# Patient Record
Sex: Female | Born: 1955 | Race: White | Hispanic: No | Marital: Married
Health system: Southern US, Academic
[De-identification: ages and names within clinical notes are randomized; demographics above are authoritative.]

## PROBLEM LIST (undated history)

## (undated) ENCOUNTER — Encounter

## (undated) ENCOUNTER — Telehealth

## (undated) ENCOUNTER — Ambulatory Visit: Payer: MEDICARE | Attending: Clinical | Primary: Clinical

## (undated) ENCOUNTER — Ambulatory Visit: Payer: MEDICARE

## (undated) ENCOUNTER — Ambulatory Visit
Payer: MEDICARE | Attending: Student in an Organized Health Care Education/Training Program | Primary: Student in an Organized Health Care Education/Training Program

## (undated) ENCOUNTER — Encounter
Attending: Student in an Organized Health Care Education/Training Program | Primary: Student in an Organized Health Care Education/Training Program

## (undated) ENCOUNTER — Ambulatory Visit: Payer: MEDICARE | Attending: Family | Primary: Family

## (undated) ENCOUNTER — Ambulatory Visit

## (undated) ENCOUNTER — Other Ambulatory Visit

## (undated) ENCOUNTER — Ambulatory Visit: Attending: Multi-Specialty | Primary: Multi-Specialty

## (undated) ENCOUNTER — Encounter
Attending: Pharmacist Clinician (PhC)/ Clinical Pharmacy Specialist | Primary: Pharmacist Clinician (PhC)/ Clinical Pharmacy Specialist

## (undated) ENCOUNTER — Ambulatory Visit: Payer: Medicare (Managed Care)

## (undated) ENCOUNTER — Inpatient Hospital Stay

## (undated) ENCOUNTER — Ambulatory Visit: Attending: Hematology & Oncology | Primary: Hematology & Oncology

## (undated) ENCOUNTER — Encounter: Attending: Clinical | Primary: Clinical

## (undated) DIAGNOSIS — E669 Obesity, unspecified: Secondary | ICD-10-CM

## (undated) DIAGNOSIS — R112 Nausea with vomiting, unspecified: Secondary | ICD-10-CM

## (undated) DIAGNOSIS — F17200 Nicotine dependence, unspecified, uncomplicated: Secondary | ICD-10-CM

## (undated) DIAGNOSIS — M1990 Unspecified osteoarthritis, unspecified site: Secondary | ICD-10-CM

## (undated) DIAGNOSIS — I110 Hypertensive heart disease with heart failure: Secondary | ICD-10-CM

## (undated) DIAGNOSIS — I499 Cardiac arrhythmia, unspecified: Secondary | ICD-10-CM

## (undated) DIAGNOSIS — J449 Chronic obstructive pulmonary disease, unspecified: Secondary | ICD-10-CM

## (undated) DIAGNOSIS — E559 Vitamin D deficiency, unspecified: Secondary | ICD-10-CM

## (undated) DIAGNOSIS — I8500 Esophageal varices without bleeding: Secondary | ICD-10-CM

## (undated) DIAGNOSIS — M3500 Sicca syndrome, unspecified: Secondary | ICD-10-CM

## (undated) DIAGNOSIS — D649 Anemia, unspecified: Secondary | ICD-10-CM

## (undated) DIAGNOSIS — G4730 Sleep apnea, unspecified: Secondary | ICD-10-CM

## (undated) DIAGNOSIS — K7581 Nonalcoholic steatohepatitis (NASH): Secondary | ICD-10-CM

## (undated) DIAGNOSIS — F32A Depression, unspecified: Secondary | ICD-10-CM

## (undated) DIAGNOSIS — K219 Gastro-esophageal reflux disease without esophagitis: Secondary | ICD-10-CM

## (undated) DIAGNOSIS — E785 Hyperlipidemia, unspecified: Secondary | ICD-10-CM

## (undated) DIAGNOSIS — R160 Hepatomegaly, not elsewhere classified: Secondary | ICD-10-CM

## (undated) DIAGNOSIS — K769 Liver disease, unspecified: Secondary | ICD-10-CM

## (undated) DIAGNOSIS — I10 Essential (primary) hypertension: Secondary | ICD-10-CM

## (undated) DIAGNOSIS — I639 Cerebral infarction, unspecified: Secondary | ICD-10-CM

## (undated) DIAGNOSIS — F419 Anxiety disorder, unspecified: Secondary | ICD-10-CM

## (undated) DIAGNOSIS — L853 Xerosis cutis: Secondary | ICD-10-CM

## (undated) DIAGNOSIS — N200 Calculus of kidney: Secondary | ICD-10-CM

## (undated) DIAGNOSIS — I447 Left bundle-branch block, unspecified: Secondary | ICD-10-CM

## (undated) DIAGNOSIS — N189 Chronic kidney disease, unspecified: Secondary | ICD-10-CM

## (undated) DIAGNOSIS — Z9889 Other specified postprocedural states: Secondary | ICD-10-CM

## (undated) DIAGNOSIS — K7460 Unspecified cirrhosis of liver: Secondary | ICD-10-CM

## (undated) DIAGNOSIS — E119 Type 2 diabetes mellitus without complications: Secondary | ICD-10-CM

## (undated) HISTORY — DX: Nicotine dependence, unspecified, uncomplicated: F17.200

## (undated) HISTORY — DX: Chronic kidney disease, unspecified: N18.9

## (undated) HISTORY — DX: Nonalcoholic steatohepatitis (NASH): K75.81

## (undated) HISTORY — DX: Chronic obstructive pulmonary disease, unspecified: J44.9

## (undated) HISTORY — DX: Xerosis cutis: L85.3

## (undated) HISTORY — DX: Sjogren syndrome, unspecified: M35.00

## (undated) HISTORY — DX: Calculus of kidney: N20.0

## (undated) HISTORY — DX: Vitamin D deficiency, unspecified: E55.9

## (undated) HISTORY — PX: HAND SURGERY: SHX662

## (undated) HISTORY — DX: Anxiety disorder, unspecified: F41.9

## (undated) HISTORY — DX: Gastro-esophageal reflux disease without esophagitis: K21.9

## (undated) HISTORY — DX: Left bundle-branch block, unspecified: I44.7

## (undated) HISTORY — DX: Sleep apnea, unspecified: G47.30

## (undated) HISTORY — PX: CARPAL TUNNEL RELEASE: SHX101

## (undated) HISTORY — PX: ROTATOR CUFF REPAIR: SHX139

## (undated) HISTORY — DX: Hepatomegaly, not elsewhere classified: R16.0

## (undated) HISTORY — DX: Unspecified cirrhosis of liver: K74.60

## (undated) HISTORY — PX: INGUINAL HERNIA REPAIR: SHX194

## (undated) HISTORY — DX: Depression, unspecified: F32.A

## (undated) HISTORY — DX: Type 2 diabetes mellitus without complications: E11.9

## (undated) HISTORY — DX: Hypertensive heart disease with heart failure: I11.0

## (undated) HISTORY — DX: Unspecified osteoarthritis, unspecified site: M19.90

## (undated) HISTORY — DX: Cerebral infarction, unspecified: I63.9

## (undated) HISTORY — DX: Liver disease, unspecified: K76.9

## (undated) HISTORY — PX: TONSILLECTOMY: SUR1361

## (undated) HISTORY — PX: BACK SURGERY: SHX140

## (undated) HISTORY — DX: Esophageal varices without bleeding: I85.00

## (undated) HISTORY — DX: Cardiac arrhythmia, unspecified: I49.9

## (undated) HISTORY — PX: CHOLECYSTECTOMY: SHX55

## (undated) HISTORY — DX: Essential (primary) hypertension: I10

## (undated) HISTORY — PX: WRIST SURGERY: SHX841

## (undated) HISTORY — DX: Obesity, unspecified: E66.9

## (undated) HISTORY — DX: Anemia, unspecified: D64.9

## (undated) HISTORY — DX: Hyperlipidemia, unspecified: E78.5

---
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
## 1987-09-22 HISTORY — PX: ABDOMINAL HYSTERECTOMY: SHX81

## 1998-04-04 ENCOUNTER — Inpatient Hospital Stay (HOSPITAL_COMMUNITY): Admission: RE | Admit: 1998-04-04 | Discharge: 1998-04-08 | Payer: Self-pay | Admitting: Neurosurgery

## 2006-05-20 ENCOUNTER — Inpatient Hospital Stay (HOSPITAL_COMMUNITY): Admission: RE | Admit: 2006-05-20 | Discharge: 2006-05-25 | Payer: Self-pay | Admitting: Neurosurgery

## 2006-05-26 ENCOUNTER — Emergency Department (HOSPITAL_COMMUNITY): Admission: EM | Admit: 2006-05-26 | Discharge: 2006-05-27 | Payer: Self-pay | Admitting: Emergency Medicine

## 2006-12-23 ENCOUNTER — Ambulatory Visit (HOSPITAL_COMMUNITY): Admission: RE | Admit: 2006-12-23 | Discharge: 2006-12-23 | Payer: Self-pay | Admitting: Neurosurgery

## 2007-06-19 ENCOUNTER — Encounter: Admission: RE | Admit: 2007-06-19 | Discharge: 2007-06-19 | Payer: Self-pay | Admitting: Neurosurgery

## 2008-03-16 ENCOUNTER — Encounter: Admission: RE | Admit: 2008-03-16 | Discharge: 2008-03-16 | Payer: Self-pay | Admitting: Specialist

## 2011-02-06 NOTE — Op Note (Signed)
NAME:  SHAQUEL, JOSEPHSON NO.:  [id number]   MEDICAL RECORD NO.:  [id number]          PATIENT TYPE:  INP   LOCATION:  8882                         FACILITY:  [facility]   PHYSICIAN:  Ophelia Charter, M.D.DATE OF BIRTH:  1956/05/20   DATE OF PROCEDURE:  05/20/2006  DATE OF DISCHARGE:                                 OPERATIVE REPORT   BRIEF HISTORY:  The patient is a 55 year old white female who underwent a  previous laminectomy at L4 and L5 by another physician about 9 years ago.  She initially did well, but over the years has developed increasing back and  radicular leg pain.  She failed medical management and was worked up with a  lumbar MRI, which demonstrated the patient and severe spinal stenosis at L3-  4 as well as degenerative disease and spondylolisthesis and stenosis at L4-  L5 and L5-S1.  I discussed the various treatment with the patient including  surgery.  The patient has weighed the risks, benefits and alternatives to  surgery and has decided to proceed with a lumbar decompression and fusion.   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   PROCEDURE:  Redo L4 laminectomy; L3 laminectomy; L3-4 and L4-5  transforaminal lumbar interbody fusion with insertion of Danek Peek  interbody prosthesis (Capstone Peek cages) at L3-4 and L4-5; posterior  segmental instrumentation, L3 to S1, with Legacy titanium pedicle screws and  rods; posterolateral arthrodesis L3-4 and L4-5 and L5-S1 using local  morcelized autograft bone and VITOSS bone graft extender.   SURGEON:  Ophelia Charter, M.D.   ASSISTANT:  Hazle Coca, M.D.   ANESTHESIA:  General endotracheal.   ESTIMATED BLOOD LOSS:  600 mL.   SPECIMENS:  None.   DRAINS:  None.   COMPLICATIONS:  Dural tear.   DESCRIPTION OF PROCEDURE:  The patient was brought to operating room by the  anesthesia team.  General endotracheal anesthesia was induced.  The patient  was turned to the prone position on the Wilson frame.  Her lumbosacral  region was then prepared with Betadine scrub and Betadine solution and  sterile drapes were applied.  I then injected area to be incised with  Marcaine with epinephrine solution and then used a scalpel to make a linear  midline incision through the patient's previous surgical scar.  I used  electrocautery to perform a bilateral subperiosteal dissection, exposing the  spinous process of L2, 3, the remainder of L4 and facets bilaterally at L5  and the upper sacrum.  I obtained an intraoperative radiograph to confirm  our location.  I then inserted VersaTack retractor for exposure.  Then I  used a high-speed drill to extend the previous L4 laminotomy in a cephalad  direction, until I encountered some relatively non-scarred dura.  I then  used a Kerrison punch to complete the L4 laminectomies and I removed the L3-  4 ligamentum flavum.  I then used a high-speed drill to perform bilateral L3  laminotomies.  I widened the laminotomies with a Kerrison punch,  decompressing the thecal sac and the bilateral L3 and L4 nerve roots.  I  then performed a foraminotomy about the bilateral L5 nerve roots, dissecting  through the epidural scar tissue.  In the process of performed a  foraminotomy about the L5 nerve root, a dural tear was created.  The  arachnoid was pooching through the dural tear, but there was no CSF leak.  The tear was anterolateral to the nerve root and it looked like it would be  next to impossible to suture repair; I therefore laid Duragen over these  this dural tear and at the end of the case, we placed Tisseel over the  DuraGen.  There was no CSF leak.    Having completed the decompression at L3-4 and L4-L5, I now turned my  attention to the posterolateral interbody fusion.  I performed a TLIF from  the right side at L3-4 and L4-5, i.e., I incised the L3-4 and 4-5  intervertebral disks and performed an aggressive diskectomy at L3-4 and 4-5,  clearing the vertebral endplates of soft tissue using the Epstein and  Scoville curettes.  I then inserted a 14 x 26-mm Capstone Peek cage which  had been prefilled with local autograft bone and VITOSS into the L3-4  interspace.  I attempted to turn it laterally with the bone tamps, but it  would not turned; I therefore left it somewhat tangential within the disk  space.  I filled ventral and posterior to the prosthesis with local  autograft bone and VITOSS, completing the posterolateral interbody fusion at  that level.  I inserted a 12 x 26-mm Capstone Peek cage into the L4-5  interspace; I was able to turns this one laterally and filled anterior and  posterior to the prosthesis in the intervertebral disk space with local  autograft bone and VITOSS, completing the posterior lumbar fusion at that  level.   We now turned attention to the posterior segmental instrumentation.  Under  fluoroscopic guidance, I cannulated the bilateral L3, 4 5 and S1 pedicles  with the bone probe.  There was limited visualization of the pedicles  because of this patient's body habitus.  I tapped the pedicles with a 5.5-mm  tap.  I then inserted a combination of 6.5 and 7.5 x 45 and a 50-mm pedicle  screws bilaterally at the L3, 4, 5 and S1 pedicles.  I probed about the  medial aspect of the L3, 4 and 5 pedicles and noted there were no cortical  breeches and the nerve roots were not injured.  I then connected the  unilateral pedicle screws with the lordotic rod.  I secured the rod in place  with the caps, which were tightened appropriately into place across the  connector between the rods, which we tightened appropriately.   This completed the instrumentation.  I then used the high-speed drill to  decorticate the remainder of the L3, 4-5 and 5-1 facets and pars.  I then  laid a combination of local morcelized autograft bone and VITOSS over these  decorticated posterolateral structures, completing the posterolateral  arthrodesis.  I then inspected the thecal sac and bilateral L3, 4 and 5  nerve roots and noted that they were well-decompressed.  We then irrigated  the wound out with bacitracin solution, removed the solution and then  removed the VersaTrac retractor and then  reapproximated the patient's  thoracolumbar fascia with interrupted #1 Vicryl suture, subcutaneous tissue  with interrupted 2-0 Vicryl suture and the skin with Steri-Strips and  Benzoin.  The wound was then coated with bacitracin ointment and a sterile  dressing applied.  The drapes were removed and the patient was subsequently  returned to the supine position and subsequently extubated by the anesthesia  team and transported to the postanesthesia care unit in stable condition.  All sponge, instrument and needle counts were correct at the end of this  case.      Ophelia Charter, M.D.  Electronically Signed     JDJ/MEDQ  D:  05/20/2006  T:  05/21/2006  Job:  [id number]

## 2011-02-06 NOTE — Discharge Summary (Signed)
NAME:  Rebecca Jimenez, Rebecca Jimenez NO.:  [id number]   MEDICAL RECORD NO.:  [id number]          PATIENT TYPE:  EMS   LOCATION:  MAJO                         FACILITY:  [facility] [facility]   PHYSICIAN:  Ophelia Charter, M.D.DATE OF BIRTH:  [birth year]   DATE OF ADMISSION:  05/26/2006  DATE OF DISCHARGE:  05/25/2006                                 DISCHARGE SUMMARY   BRIEF HISTORY:  The patient is a 55 year old white female who underwent a  previous L4 and L5 laminectomy by another physician about 9 years ago.  She  initially did well but over the years has developed increasing back and  radicular leg pain.  She failed medical management.  Was worked up with a  lumbar MRI which demonstrated the patient had severe spinal stenosis at L3-  4, as well as degenerative disk disease and spondylolisthesis and stenosis  at L4-5 and L5-S1.  I discussed the various treatment options with the  patient, including surgery.  The patient is aware of the risks, benefits,  and alternatives to surgery and decided to proceed with a lumbar  decompression and fusion.   For further details of this admission, please refer to typed history and  physical.   HOSPITAL COURSE:  Admitted the patient to [facility] on May 20, 2006.  On the day of admission, I performed an L3-4, L4-L5, L5-S1  decompression and fusion.  At surgery, there was a dural tear with some CSF  leak.  The patient was kept flat for a couple of days after surgery.  The  remainder of her hospital course was unremarkable.  By postop day #5, the  patient was afebrile.  Breath sounds stable.  She was eating well,  ambulating well, and had no headache.  Was requesting discharge to home.  She was therefore discharged to home on May 25, 2006.   DISCHARGE INSTRUCTIONS:  The patient was given written discharge  instructions.  Instructed to follow up with me in 4 weeks.   DISCHARGE PRESCRIPTIONS:  Lortab 10, #100, one p.o. q.4h. p.r.n. for  pain;  Valium 5 mg, #50, one p.o. q.6h. p.r.n. for muscle spasm.   FINAL DIAGNOSES:  1. 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  2. L4-5 and L5-S1 degenerative disease, spondylolysis, stenosis, and      lumbar radiculopathy lumbago.   PROCEDURES PERFORMED:  Redo L4 laminectomy; L3 laminectomy; L3-4 and L4-5  transforaminal lumbar interbody fusion with insertion of Capstone PEEK cages  at L3-4 and L4-5; posterior segmental instrumentation L3 to S1 with Legacy  titanium pedicle screws and rods; posterolateral arthrodesis L3-4, L4-5, and  L5-S1 utilizing local morcellized autograft bone and Vitoss bone graft  extender.      Ophelia Charter, M.D.  Electronically Signed     JDJ/MEDQ  D:  06/10/2006  T:  06/10/2006  Job:  [id number]

## 2011-04-14 ENCOUNTER — Encounter (HOSPITAL_BASED_OUTPATIENT_CLINIC_OR_DEPARTMENT_OTHER): Payer: Medicare Other | Attending: General Surgery

## 2011-04-14 DIAGNOSIS — M7989 Other specified soft tissue disorders: Secondary | ICD-10-CM | POA: Insufficient documentation

## 2011-04-14 DIAGNOSIS — I10 Essential (primary) hypertension: Secondary | ICD-10-CM | POA: Insufficient documentation

## 2011-04-14 DIAGNOSIS — E119 Type 2 diabetes mellitus without complications: Secondary | ICD-10-CM | POA: Insufficient documentation

## 2011-04-14 DIAGNOSIS — I872 Venous insufficiency (chronic) (peripheral): Secondary | ICD-10-CM | POA: Insufficient documentation

## 2011-04-14 DIAGNOSIS — L02419 Cutaneous abscess of limb, unspecified: Secondary | ICD-10-CM | POA: Insufficient documentation

## 2011-04-14 DIAGNOSIS — Z79899 Other long term (current) drug therapy: Secondary | ICD-10-CM | POA: Insufficient documentation

## 2011-05-19 ENCOUNTER — Encounter (HOSPITAL_BASED_OUTPATIENT_CLINIC_OR_DEPARTMENT_OTHER): Payer: Medicare Other

## 2011-05-19 DIAGNOSIS — E119 Type 2 diabetes mellitus without complications: Secondary | ICD-10-CM | POA: Insufficient documentation

## 2011-05-19 DIAGNOSIS — Z79899 Other long term (current) drug therapy: Secondary | ICD-10-CM | POA: Insufficient documentation

## 2011-05-19 DIAGNOSIS — I10 Essential (primary) hypertension: Secondary | ICD-10-CM | POA: Insufficient documentation

## 2011-05-19 DIAGNOSIS — I872 Venous insufficiency (chronic) (peripheral): Secondary | ICD-10-CM | POA: Insufficient documentation

## 2011-05-19 DIAGNOSIS — M7989 Other specified soft tissue disorders: Secondary | ICD-10-CM | POA: Insufficient documentation

## 2011-05-19 DIAGNOSIS — L03119 Cellulitis of unspecified part of limb: Secondary | ICD-10-CM | POA: Insufficient documentation

## 2011-05-19 DIAGNOSIS — L02419 Cutaneous abscess of limb, unspecified: Secondary | ICD-10-CM | POA: Insufficient documentation

## 2011-06-24 ENCOUNTER — Other Ambulatory Visit: Payer: Self-pay | Admitting: Rheumatology

## 2011-06-24 DIAGNOSIS — R609 Edema, unspecified: Secondary | ICD-10-CM

## 2011-07-01 ENCOUNTER — Ambulatory Visit
Admission: RE | Admit: 2011-07-01 | Discharge: 2011-07-01 | Disposition: A | Discharge status: Home / Self Care | Payer: BC Managed Care – PPO | Source: Ambulatory Visit | Attending: Rheumatology | Admitting: Rheumatology

## 2011-07-01 DIAGNOSIS — R609 Edema, unspecified: Secondary | ICD-10-CM

## 2011-07-14 ENCOUNTER — Other Ambulatory Visit: Payer: Self-pay | Admitting: Rheumatology

## 2011-07-14 DIAGNOSIS — K118 Other diseases of salivary glands: Secondary | ICD-10-CM
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
## 2011-07-18 ENCOUNTER — Ambulatory Visit
Admission: RE | Admit: 2011-07-18 | Discharge: 2011-07-18 | Disposition: A | Discharge status: Home / Self Care | Payer: BC Managed Care – PPO | Source: Ambulatory Visit | Attending: Rheumatology | Admitting: Rheumatology

## 2011-07-18 DIAGNOSIS — K118 Other diseases of salivary glands: Secondary | ICD-10-CM

## 2011-07-18 MED ORDER — GADOBENATE DIMEGLUMINE 529 MG/ML IV SOLN
20.0000 mL | Freq: Once | INTRAVENOUS | Status: AC | PRN
  Administered 2011-07-18: 20 mL via INTRAVENOUS

## 2011-09-22 HISTORY — PX: KNEE SURGERY: SHX244

## 2013-01-16 ENCOUNTER — Ambulatory Visit (INDEPENDENT_AMBULATORY_CARE_PROVIDER_SITE_OTHER): Payer: BC Managed Care – PPO | Admitting: Neurology

## 2013-01-16 ENCOUNTER — Encounter: Payer: Self-pay | Admitting: Neurology

## 2013-01-16 VITALS — BP 136/68 | HR 71 | Ht 63.5 in | Wt 284.0 lb

## 2013-01-16 DIAGNOSIS — R269 Unspecified abnormalities of gait and mobility: Secondary | ICD-10-CM
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
## 2013-01-23 ENCOUNTER — Ambulatory Visit
Admission: RE | Admit: 2013-01-23 | Discharge: 2013-01-23 | Disposition: A | Discharge status: Home / Self Care | Payer: Medicare Other | Source: Ambulatory Visit | Attending: Neurology | Admitting: Neurology

## 2013-01-23 DIAGNOSIS — R269 Unspecified abnormalities of gait and mobility: Secondary | ICD-10-CM

## 2013-01-24 ENCOUNTER — Ambulatory Visit: Payer: BC Managed Care – PPO | Attending: Neurology | Admitting: Physical Therapy

## 2013-01-24 DIAGNOSIS — R269 Unspecified abnormalities of gait and mobility: Secondary | ICD-10-CM | POA: Insufficient documentation

## 2013-01-24 DIAGNOSIS — IMO0001 Reserved for inherently not codable concepts without codable children: Secondary | ICD-10-CM | POA: Insufficient documentation

## 2013-01-24 DIAGNOSIS — M6281 Muscle weakness (generalized): Secondary | ICD-10-CM | POA: Insufficient documentation

## 2013-01-27 ENCOUNTER — Ambulatory Visit: Payer: BC Managed Care – PPO | Admitting: Physical Therapy

## 2013-01-30 ENCOUNTER — Ambulatory Visit: Payer: BC Managed Care – PPO

## 2013-01-31 NOTE — Progress Notes (Signed)
Quick Note:  I have called and left message to Rebecca Jimenez, MRI brain showed no acute lesion, no change compared in Nov 2013. ______

## 2013-02-01 ENCOUNTER — Ambulatory Visit: Payer: BC Managed Care – PPO

## 2013-02-06 ENCOUNTER — Ambulatory Visit: Payer: BC Managed Care – PPO

## 2013-02-08 ENCOUNTER — Ambulatory Visit: Payer: BC Managed Care – PPO

## 2013-02-08 ENCOUNTER — Telehealth: Payer: Self-pay | Admitting: Neurology

## 2013-02-08 MED ORDER — GABAPENTIN (ONCE-DAILY) 300 MG PO TABS: 300.0000 mg | ORAL_TABLET | Freq: Three times a day (TID) | ORAL | Status: DC

## 2013-02-08 NOTE — Telephone Encounter (Signed)
i have called her, she complains of headaches, also left facial numbness, tingly, lasting few hours.  I will gabapentin 353m tid.

## 2013-02-08 NOTE — Telephone Encounter (Signed)
Physical Therapist Anderson Malta @ [facility] requesting c/b in reference to patient's condition. Returned call no answer.

## 2013-02-08 NOTE — Telephone Encounter (Signed)
Having episodes of tingling all over face , had one this morning, when having them patient feels speech is slurred.  Is this normal for trigimenal neuralgia?

## 2013-02-14 ENCOUNTER — Ambulatory Visit: Payer: BC Managed Care – PPO | Admitting: Physical Therapy

## 2013-02-16 ENCOUNTER — Encounter: Payer: Self-pay | Admitting: Neurology

## 2013-02-16 ENCOUNTER — Ambulatory Visit: Payer: BC Managed Care – PPO | Admitting: Physical Therapy

## 2013-02-16 ENCOUNTER — Ambulatory Visit (INDEPENDENT_AMBULATORY_CARE_PROVIDER_SITE_OTHER): Payer: BC Managed Care – PPO | Admitting: Neurology

## 2013-02-16 VITALS — BP 153/79 | HR 98 | Ht 63.5 in | Wt 286.0 lb

## 2013-02-16 DIAGNOSIS — E785 Hyperlipidemia, unspecified: Secondary | ICD-10-CM

## 2013-02-16 DIAGNOSIS — R519 Headache, unspecified: Secondary | ICD-10-CM

## 2013-02-16 DIAGNOSIS — R51 Headache: Secondary | ICD-10-CM

## 2013-02-16 DIAGNOSIS — I10 Essential (primary) hypertension: Secondary | ICD-10-CM

## 2013-02-16 DIAGNOSIS — E669 Obesity, unspecified: Secondary | ICD-10-CM

## 2013-02-16 MED ORDER — DICLOFENAC POTASSIUM(MIGRAINE) 50 MG PO PACK: 50.0000 mg | PACK | ORAL | Status: DC | PRN

## 2013-02-16 MED ORDER — TOPIRAMATE 50 MG PO TABS: 100.0000 mg | ORAL_TABLET | Freq: Two times a day (BID) | ORAL | Status: DC
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
## 2013-02-17 ENCOUNTER — Telehealth: Payer: Self-pay | Admitting: Neurology

## 2013-02-17 NOTE — Telephone Encounter (Signed)
Amy from patient's pharmacy is calling to tell us she has called x3 times.  She needs to confirm dosing instructions for patient's Topomax asap.  Her call back number is:  [phone]

## 2013-02-17 NOTE — Telephone Encounter (Signed)
We do not show that they have called [place] any time other than this one.  I called back, left message.  Also manually faxed Rx.  Directions are on it, of one bid x 1 week.  After that patient takes 2 bid.

## 2013-02-21 ENCOUNTER — Ambulatory Visit: Payer: BC Managed Care – PPO | Attending: Neurology | Admitting: Physical Therapy

## 2013-02-21 DIAGNOSIS — M6281 Muscle weakness (generalized): Secondary | ICD-10-CM | POA: Insufficient documentation

## 2013-02-21 DIAGNOSIS — IMO0001 Reserved for inherently not codable concepts without codable children: Secondary | ICD-10-CM | POA: Insufficient documentation

## 2013-02-21 DIAGNOSIS — R269 Unspecified abnormalities of gait and mobility: Secondary | ICD-10-CM | POA: Insufficient documentation

## 2013-02-24 ENCOUNTER — Ambulatory Visit: Payer: BC Managed Care – PPO | Admitting: Physical Therapy

## 2013-05-24 ENCOUNTER — Ambulatory Visit: Payer: Medicare Other | Admitting: Nurse Practitioner

## 2013-06-08 ENCOUNTER — Ambulatory Visit: Payer: BC Managed Care – PPO | Admitting: Nurse Practitioner

## 2013-08-21 HISTORY — PX: LIVER BIOPSY: SHX301

## 2014-06-01 HISTORY — PX: INCISIONAL HERNIA REPAIR: SHX193

## 2014-11-29 HISTORY — PX: COLONOSCOPY: SHX174

## 2015-05-28 ENCOUNTER — Other Ambulatory Visit: Payer: Self-pay

## 2015-05-28 HISTORY — PX: BRONCHOSCOPY: SUR163

## 2016-04-30 HISTORY — PX: ESOPHAGOGASTRODUODENOSCOPY: SHX1529

## 2017-04-09 ENCOUNTER — Telehealth: Payer: Self-pay | Admitting: Cardiology

## 2017-04-09 NOTE — Telephone Encounter (Signed)
04/09/17 - Received records from [facility] and [facility] for upcoming appointment with Dr. Martinique on 05/10/17 @ 8:40am. Records given to [facility] ab

## 2017-05-06 NOTE — Progress Notes (Signed)
Cardiology Office Note   Date:  05/10/2017   ID:  Rebecca Jimenez, Rebecca Jimenez [birth year], MRN [id number]  PCP:  Melony Overly, MD  Cardiologist:   Atalaya Zappia Martinique, MD   Chief Complaint  Patient presents with  . Chest Pain    pt states some pressure and tightness sometimes   . Edema    states both feet but more on the left   . New Patient (Initial Visit)    some dizziness       History of Present Illness: My Rebecca Jimenez is a 61 y.o. female who is seen at the request of Dr. Thurnell Garbe for evaluation of hypertensive heart disease and PVCs. Previously followed by Dr. Jimmie Molly in [place]. She has a history of DM, HTN, hepatic cirrhosis, and Sjogren's syndrome. She had remote cardiac cath in 2009 showing no significant CAD. Last evaluated in September 2017 for chest pain. Myoview showed no ischemia with EF 46%. Echo showed global HK with "borderline" LV dysfunction. She has a chronic LBBB. This information obtained from Dr. Andrey Campanile record.   She reports that she is no longer on an insulin pump. She was previously on Coreg and lisinopril but her prescriptions ran out and she quit taking. Was going to the gym 3x/week but fell and injured her back in March and hasn't been back. Notes sometimes her heart "has a hard time beating". Notes pulse ox will sometimes read a low HR in the 40s. She has occasional sharp stabbing pain in central chest not associated with activity.   Past Medical History:  Diagnosis Date  . Diabetes mellitus without complication ([place])   . Dry skin    Dry skin from sjorgrens  . Hypertension   . Irregular heart beat   . NASH (nonalcoholic steatohepatitis)   . Sjogren's syndrome ([place])   . Sleep apnea   . Stroke [facility])     Past Surgical History:  Procedure Laterality Date  . ABDOMINAL HYSTERECTOMY  1989  . BACK SURGERY  2000, 2007  . CHOLECYSTECTOMY    . COSMETIC SURGERY  1978   Face  . HAND SURGERY  1978, 1983  . INGUINAL HERNIA REPAIR Left   . KNEE  SURGERY  2013  . ROTATOR CUFF REPAIR Left   . TONSILLECTOMY       Current Outpatient Prescriptions  Medication Sig Dispense Refill  . furosemide (LASIX) 80 MG tablet Take 80 mg by mouth 2 (two) times daily.    [name] ibuprofen (ADVIL,MOTRIN) 600 MG tablet Take 600 mg by mouth every 6 (six) hours as needed for pain.    . TRULICITY 1.44 RX/5.4MG SOPN Inject 1.5 mg as directed once a week.    . carvedilol (COREG) 6.25 MG tablet Take 1 tablet (6.25 mg total) by mouth 2 (two) times daily. 180 tablet 3  . lisinopril (PRINIVIL,ZESTRIL) 5 MG tablet Take 1 tablet (5 mg total) by mouth daily. 90 tablet 3  . rosuvastatin (CRESTOR) 10 MG tablet Take 1 tablet (10 mg total) by mouth daily. 90 tablet 3   No current facility-administered medications for this visit.     Allergies:   Adenosine; Codeine; Morphine and related; Penicillins; and Rocephin [ceftriaxone sodium in dextrose]    Social History:  The patient  reports that she quit smoking about 2 years ago. Her smoking use included Cigarettes. She smoked 0.25 packs per day. She has never used smokeless tobacco. She reports that she drinks alcohol. She reports that she does not use drugs.  Family History:  The patient's family history includes Cancer - Prostate in her father; Cerebrovascular Accident in her father; Diabetes in her brother, father, and mother; Heart disease in her father.    ROS:  Please see the history of present illness.   Otherwise, review of systems are positive for none.   All other systems are reviewed and negative.    PHYSICAL EXAM: VS:  BP (!) 142/68 (BP Location: Left Arm, Patient Position: Sitting, Cuff Size: Large)   Pulse 77   Ht 5' 3"  (1.6 m)   Wt 280 lb 9.6 oz (127.3 kg)   BMI 49.71 kg/m  , BMI Body mass index is 49.71 kg/m. GEN: Well nourished, morbidly obese, in no acute distress  HEENT: normal  Neck: no JVD, carotid bruits, or masses Cardiac: RRR; no murmurs, rubs, or gallops,no edema  Respiratory:  clear to  auscultation bilaterally, normal work of breathing GI: soft, nontender, nondistended, + BS MS: no deformity or atrophy  Skin: chronic hyperpigmentation in lower extremities. Neuro:  Strength and sensation are intact Psych: euthymic mood, full affect   EKG:  EKG is ordered today. The ekg ordered today demonstrates NSR with frequent PVCs in a pattern of trigeminy. LBBB. Rate 78.    Recent Labs: No results found for requested labs within last 8760 hours.    Lipid Panel No results found for: CHOL, TRIG, HDL, CHOLHDL, VLDL, LDLCALC, LDLDIRECT    Wt Readings from Last 3 Encounters:  05/10/17 280 lb 9.6 oz (127.3 kg)  02/16/13 286 lb (129.7 kg)  01/16/13 284 lb (128.8 kg)      Other studies Reviewed: Additional studies/ records that were reviewed today include:  Labs dated 02/10/17: cholesterol 186, triglycerides 133, HDL 53, LDL 107.  Dated 03/11/17: normal chemistry panel. Dated 03/26/17: Hgb 12.9.   ASSESSMENT AND PLAN:  1.  Nonischemic cardiomyopathy. LV function mildly impaired by Echo and Myoview. This may be related to hypertensive heart disease, DM, or LBBB with dyssynchrony. Recommend resumption of Coreg 6.25 mg bid and lisinopril 5 mg daily. Sodium restriction 2. HTN with hypertensive heart disease. As noted in #1.  3. Hypercholesterolemia. LDL 107. With diabetes I would recommend she go on a statin drug. Will start Crestor 10 mg daily. Repeat fasting lab in 3 months. 4. PVCs. Mildly symptomatic. This is leading to some undercounting of her HR on pulse Ox. Will see how she responds to Coreg. 5. Morbid obesity. Stressed importance of regular aerobic exercise and weight loss. 6. DM type 2- per primary care.  7. LBBB chronic    Current medicines are reviewed at length with the patient today.  The patient does not have concerns regarding medicines.  The following changes have been made:  See above  Labs/ tests ordered today include: none No orders of the defined types  were placed in this encounter.    Disposition:   FU with me in 3 months  Signed, Naleah Kofoed [place], MD  05/10/2017 9:17 AM    [facility] [address] [place], [place], [address] Phone [phone], Fax [phone]

## 2017-05-10 ENCOUNTER — Ambulatory Visit (INDEPENDENT_AMBULATORY_CARE_PROVIDER_SITE_OTHER): Payer: Medicare PPO | Admitting: Cardiology

## 2017-05-10 ENCOUNTER — Encounter (INDEPENDENT_AMBULATORY_CARE_PROVIDER_SITE_OTHER): Payer: Self-pay

## 2017-05-10 ENCOUNTER — Encounter: Payer: Self-pay | Admitting: Cardiology

## 2017-05-10 VITALS — BP 142/68 | HR 77 | Ht 63.0 in | Wt 280.6 lb

## 2017-05-10 DIAGNOSIS — I493 Ventricular premature depolarization: Secondary | ICD-10-CM

## 2017-05-10 DIAGNOSIS — E119 Type 2 diabetes mellitus without complications: Secondary | ICD-10-CM | POA: Diagnosis not present

## 2017-05-10 DIAGNOSIS — E7800 Pure hypercholesterolemia, unspecified: Secondary | ICD-10-CM

## 2017-05-10 DIAGNOSIS — I428 Other cardiomyopathies: Secondary | ICD-10-CM

## 2017-05-10 DIAGNOSIS — I447 Left bundle-branch block, unspecified: Secondary | ICD-10-CM

## 2017-05-10 DIAGNOSIS — I10 Essential (primary) hypertension: Secondary | ICD-10-CM

## 2017-05-10 MED ORDER — LISINOPRIL 5 MG PO TABS: 5.0000 mg | ORAL_TABLET | Freq: Every day | ORAL | 3 refills | Status: DC

## 2017-05-10 MED ORDER — ROSUVASTATIN CALCIUM 10 MG PO TABS: 10.0000 mg | ORAL_TABLET | Freq: Every day | ORAL | 3 refills | Status: DC

## 2017-05-10 MED ORDER — CARVEDILOL 6.25 MG PO TABS: 6.2500 mg | ORAL_TABLET | Freq: Two times a day (BID) | ORAL | 3 refills | Status: DC

## 2017-05-10 NOTE — Patient Instructions (Signed)
Resume Carvedilol 6.25 mg bid and lisinopril 5 mg daily for Blood pressure and to help your heart function  Start Crestor 10 mg daily for cholesterol.  Resume regular aerobic activity  We will follow up in 3 months with fasting lab work

## 2017-06-16 DIAGNOSIS — K7581 Nonalcoholic steatohepatitis (NASH): Secondary | ICD-10-CM | POA: Diagnosis not present

## 2017-06-16 DIAGNOSIS — D6959 Other secondary thrombocytopenia: Secondary | ICD-10-CM | POA: Diagnosis not present

## 2017-06-16 DIAGNOSIS — R161 Splenomegaly, not elsewhere classified: Secondary | ICD-10-CM | POA: Diagnosis not present

## 2017-06-16 DIAGNOSIS — R635 Abnormal weight gain: Secondary | ICD-10-CM | POA: Diagnosis not present

## 2017-07-30 ENCOUNTER — Encounter: Payer: Self-pay | Admitting: Cardiology

## 2017-08-07 NOTE — Progress Notes (Deleted)
Cardiology Office Note   Date:  08/07/2017   ID:  Brinkley, Peet Feb 09, 1956, MRN [id number]  PCP:  Melony Overly, MD  Cardiologist:   Likisha Alles [place], MD   No chief complaint on file.     History of Present Illness: Rebecca Jimenez is a 61 y.o. female who is seen at the request of Dr. Thurnell Garbe for evaluation of hypertensive heart disease and PVCs. Previously followed by Dr. Jimmie Molly in [place]. She has a history of DM, HTN, hepatic cirrhosis, and Sjogren's syndrome. She had remote cardiac cath in 2009 showing no significant CAD. Last evaluated in September 2017 for chest pain. Myoview showed no ischemia with EF 46%. Echo showed global HK with "borderline" LV dysfunction. She has a chronic LBBB.   She reports that she is no longer on an insulin pump. She was previously on Coreg and lisinopril but her prescriptions ran out and she quit taking. Was going to the gym 3x/week but fell and injured her back in March and hasn't been back. Notes sometimes her heart "has a hard time beating". Notes pulse ox will sometimes read a low HR in the 40s. She has occasional sharp stabbing pain in central chest not associated with activity.   Past Medical History:  Diagnosis Date  . Diabetes mellitus without complication ([place])   . Dry skin    Dry skin from sjorgrens  . Hypertension   . Irregular heart beat   . NASH (nonalcoholic steatohepatitis)   . Sjogren's syndrome ([place])   . Sleep apnea   . Stroke [facility])     Past Surgical History:  Procedure Laterality Date  . ABDOMINAL HYSTERECTOMY  1989  . BACK SURGERY  2000, 2007  . CHOLECYSTECTOMY    . COSMETIC SURGERY  1978   Face  . HAND SURGERY  1978, 1983  . INGUINAL HERNIA REPAIR Left   . KNEE SURGERY  2013  . ROTATOR CUFF REPAIR Left   . TONSILLECTOMY       Current Outpatient Medications  Medication Sig Dispense Refill  . carvedilol (COREG) 6.25 MG tablet Take 1 tablet (6.25 mg total) by mouth 2 (two) times daily. 180  tablet 3  . furosemide (LASIX) 80 MG tablet Take 80 mg by mouth 2 (two) times daily.    [name] ibuprofen (ADVIL,MOTRIN) 600 MG tablet Take 600 mg by mouth every 6 (six) hours as needed for pain.    [name] lisinopril (PRINIVIL,ZESTRIL) 5 MG tablet Take 1 tablet (5 mg total) by mouth daily. 90 tablet 3  . rosuvastatin (CRESTOR) 10 MG tablet Take 1 tablet (10 mg total) by mouth daily. 90 tablet 3  . TRULICITY 1.24 PY/0.9XI SOPN Inject 1.5 mg as directed once a week.     No current facility-administered medications for this visit.     Allergies:   Adenosine; Codeine; Morphine and related; Penicillins; and Rocephin [ceftriaxone sodium in dextrose]    Social History:  The patient  reports that she quit smoking about 2 years ago. Her smoking use included cigarettes. She smoked 0.25 packs per day. she has never used smokeless tobacco. She reports that she drinks alcohol. She reports that she does not use drugs.   Family History:  The patient's family history includes Cancer - Prostate in her father; Cerebrovascular Accident in her father; Diabetes in her brother, father, and mother; Heart disease in her father.    ROS:  Please see the history of present illness.   Otherwise, review of systems are  positive for none.   All other systems are reviewed and negative.    PHYSICAL EXAM: VS:  There were no vitals taken for this visit. , BMI There is no height or weight on file to calculate BMI. GEN: Well nourished, morbidly obese, in no acute distress  HEENT: normal  Neck: no JVD, carotid bruits, or masses Cardiac: RRR; no murmurs, rubs, or gallops,no edema  Respiratory:  clear to auscultation bilaterally, normal work of breathing GI: soft, nontender, nondistended, + BS MS: no deformity or atrophy  Skin: chronic hyperpigmentation in lower extremities. Neuro:  Strength and sensation are intact Psych: euthymic mood, full affect   EKG:  EKG is ordered today. The ekg ordered today demonstrates NSR with frequent  PVCs in a pattern of trigeminy. LBBB. Rate 78.    Recent Labs: No results found for requested labs within last 8760 hours.    Lipid Panel No results found for: CHOL, TRIG, HDL, CHOLHDL, VLDL, LDLCALC, LDLDIRECT    Wt Readings from Last 3 Encounters:  05/10/17 280 lb 9.6 oz (127.3 kg)  02/16/13 286 lb (129.7 kg)  01/16/13 284 lb (128.8 kg)      Other studies Reviewed: Additional studies/ records that were reviewed today include:  Labs dated 02/10/17: cholesterol 186, triglycerides 133, HDL 53, LDL 107.  Dated 03/11/17: normal chemistry panel. Dated 03/26/17: Hgb 12.9.   ASSESSMENT AND PLAN:  1.  Nonischemic cardiomyopathy. LV function mildly impaired by Echo and Myoview. This may be related to hypertensive heart disease, DM, or LBBB with dyssynchrony. Recommend resumption of Coreg 6.25 mg bid and lisinopril 5 mg daily. Sodium restriction 2. HTN with hypertensive heart disease. As noted in #1.  3. Hypercholesterolemia. LDL 107. With diabetes I would recommend she go on a statin drug. Will start Crestor 10 mg daily. Repeat fasting lab in 3 months. 4. PVCs. Mildly symptomatic. This is leading to some undercounting of her HR on pulse Ox. Will see how she responds to Coreg. 5. Morbid obesity. Stressed importance of regular aerobic exercise and weight loss. 6. DM type 2- per primary care.  7. LBBB chronic    Current medicines are reviewed at length with the patient today.  The patient does not have concerns regarding medicines.  The following changes have been made:  See above  Labs/ tests ordered today include: none No orders of the defined types were placed in this encounter.    Disposition:   FU with me in 3 months  Signed, Dorrian Doggett [place], MD  08/07/2017 8:03 AM    [facility] [address], [place], [place], [address] Phone [phone], Fax [phone]

## 2017-08-10 ENCOUNTER — Ambulatory Visit: Payer: Medicare PPO | Admitting: Cardiology

## 2017-08-16 NOTE — Progress Notes (Signed)
Cardiology Office Note   Date:  08/19/2017   ID:  Rebecca Jimenez 1956/05/23, MRN [id number]  PCP:  Melony Overly, MD  Cardiologist:   Seirra Kos [place], MD   Chief Complaint  Patient presents with  . Shortness of Breath  . Hypertension      History of Present Illness: Rebecca Jimenez is a 61 y.o. female who is seen for follow up  hypertensive heart disease and PVCs.  She has a history of DM, HTN, hepatic cirrhosis, and Sjogren's syndrome. She had remote cardiac cath in 2009 showing no significant CAD. Last evaluated in September 2017 for chest pain. Myoview showed no ischemia with EF 46%. Echo showed global HK with "borderline" LV dysfunction. She has a chronic LBBB. This information obtained from Dr. Andrey Campanile record.   On her last visit we started her back on lisinopril and Coreg at low dose. Also started Crestor. She is tolerating these well. Notes 22 lbs weight gain since then and feels she is retaining fluid. Notes some SOB when more active or bending over. Tries to watch salt intake. Has difficulty chewing food due to poor dentition. Dental work postponed due to low platelets.    Past Medical History:  Diagnosis Date  . Diabetes mellitus without complication ([place])   . Dry skin    Dry skin from sjorgrens  . Hypertension   . Irregular heart beat   . NASH (nonalcoholic steatohepatitis)   . Sjogren's syndrome ([place])   . Sleep apnea   . Stroke [facility])     Past Surgical History:  Procedure Laterality Date  . ABDOMINAL HYSTERECTOMY  1989  . BACK SURGERY  2000, 2007  . CHOLECYSTECTOMY    . COSMETIC SURGERY  1978   Face  . HAND SURGERY  1978, 1983  . INGUINAL HERNIA REPAIR Left   . KNEE SURGERY  2013  . ROTATOR CUFF REPAIR Left   . TONSILLECTOMY       Current Outpatient Medications  Medication Sig Dispense Refill  . rosuvastatin (CRESTOR) 10 MG tablet Take 1 tablet (10 mg total) by mouth daily. 90 tablet 3  . TRULICITY 9.39 QZ/0.0PQ SOPN Inject 1.5  mg as directed once a week.    . carvedilol (COREG) 12.5 MG tablet Take 1 tablet (12.5 mg total) by mouth 2 (two) times daily. 180 tablet 3  . furosemide (LASIX) 40 MG tablet Take 1 tablet (40 mg total) by mouth 2 (two) times daily. Take 80 mg in the morning and 40 mg in the afternoon 270 tablet 3  . lisinopril (PRINIVIL,ZESTRIL) 10 MG tablet Take 1 tablet (10 mg total) by mouth daily. 90 tablet 3   No current facility-administered medications for this visit.     Allergies:   Adenosine; Codeine; Morphine and related; Penicillins; and Rocephin [ceftriaxone sodium in dextrose]    Social History:  The patient  reports that she quit smoking about 2 years ago. Her smoking use included cigarettes. She smoked 0.25 packs per day. she has never used smokeless tobacco. She reports that she drinks alcohol. She reports that she does not use drugs.   Family History:  The patient's family history includes Cancer - Prostate in her father; Cerebrovascular Accident in her father; Diabetes in her brother, father, and mother; Heart disease in her father.    ROS:  Please see the history of present illness.   Otherwise, review of systems are positive for none.   All other systems are reviewed and negative.  PHYSICAL EXAM: VS:  BP (!) 155/80   Pulse 80   Ht 5' 3"  (1.6 m)   Wt (!) 302 lb (137 kg)   SpO2 98%   BMI 53.50 kg/m  , BMI Body mass index is 53.5 kg/m. GENERAL:  Well appearing, morbidly obese WF  HEENT:  PERRL, EOMI, sclera are clear. Oropharynx is clear. Poor dentition with multiple caries. NECK:  No jugular venous distention, carotid upstroke brisk and symmetric, no bruits, no thyromegaly or adenopathy LUNGS:  Clear to auscultation bilaterally CHEST:  Unremarkable HEART:  RRR,  PMI not displaced or sustained,S1 and S2 within normal limits, no S3, no S4: no clicks, no rubs, no murmurs ABD:  Soft, nontender. BS +, no masses or bruits. No hepatomegaly, no splenomegaly EXT:  2 + pulses  throughout, 1+ edema, no cyanosis no clubbing SKIN:  Warm and dry.  No rashes NEURO:  Alert and oriented x 3. Cranial nerves II through XII intact. PSYCH:  Cognitively intact     EKG:  EKG is  Not ordered today. The ekg ordered today demonstrates N/A.    Recent Labs: No results found for requested labs within last 8760 hours.    Lipid Panel No results found for: CHOL, TRIG, HDL, CHOLHDL, VLDL, LDLCALC, LDLDIRECT    Wt Readings from Last 3 Encounters:  08/19/17 (!) 302 lb (137 kg)  05/10/17 280 lb 9.6 oz (127.3 kg)  02/16/13 286 lb (129.7 kg)      Other studies Reviewed: Additional studies/ records that were reviewed today include:  Labs dated 02/10/17: cholesterol 186, triglycerides 133, HDL 53, LDL 107.  Dated 03/11/17: normal chemistry panel. Dated 03/26/17: Hgb 12.9.  Dated 06/14/17: cholesterol 195, triglycerides 107, HDL 73, LDL 101. A1c 6.2%. Hgb 12.6. plts 83K. Chemistries normal.TSH normal.   ASSESSMENT AND PLAN:  1.  Nonischemic cardiomyopathy. LV function mildly impaired by Echo and Myoview. This may be related to hypertensive heart disease, DM, or LBBB with dyssynchrony. Recommend increasing Coreg to 12.5  mg bid and lisinopril to 10  mg daily. Strict Sodium restriction. Will increase lasix to 80 mg in the am and 40 mg in the pm.  2. HTN with hypertensive heart disease. As noted in #1.  3. Hypercholesterolemia. LDL 107 - improved to 101 after one month of Crestor. I will keep her on same dose given her cirrhosis.  4. PVCs. Mildly symptomatic. This is leading to some undercounting of her HR on pulse Ox. Asymptomatic. 5. Morbid obesity. Stressed importance of regular aerobic exercise and weight loss. 6. DM type 2- per primary care.  7. LBBB chronic 8. Poor dentition. 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 I would think she would be able to tolerate dental work with low risk of bleeding.    Current medicines are reviewed at  length with the patient today.  The patient does not have concerns regarding medicines.  The following changes have been made:  See above  Labs/ tests ordered today include: none No orders of the defined types were placed in this encounter.    Disposition:   FU with me in 3 months  Signed, Rebecca Platte [place], MD  08/19/2017 3:53 PM    [facility] [address], [place], [place], [address] Phone [phone], Fax [phone]

## 2017-08-19 ENCOUNTER — Ambulatory Visit: Payer: Medicare PPO | Admitting: Cardiology

## 2017-08-19 ENCOUNTER — Encounter: Payer: Self-pay | Admitting: Cardiology

## 2017-08-19 VITALS — BP 155/80 | HR 80 | Ht 63.0 in | Wt 302.0 lb

## 2017-08-19 DIAGNOSIS — I447 Left bundle-branch block, unspecified: Secondary | ICD-10-CM | POA: Diagnosis not present

## 2017-08-19 DIAGNOSIS — I428 Other cardiomyopathies: Secondary | ICD-10-CM | POA: Diagnosis not present

## 2017-08-19 DIAGNOSIS — E7800 Pure hypercholesterolemia, unspecified: Secondary | ICD-10-CM

## 2017-08-19 DIAGNOSIS — I10 Essential (primary) hypertension: Secondary | ICD-10-CM

## 2017-08-19 MED ORDER — FUROSEMIDE 40 MG PO TABS: 40.0000 mg | ORAL_TABLET | Freq: Two times a day (BID) | ORAL | 3 refills | Status: DC

## 2017-08-19 MED ORDER — CARVEDILOL 12.5 MG PO TABS: 12.5000 mg | ORAL_TABLET | Freq: Two times a day (BID) | ORAL | 3 refills | Status: DC

## 2017-08-19 MED ORDER — LISINOPRIL 10 MG PO TABS: 10.0000 mg | ORAL_TABLET | Freq: Every day | ORAL | 3 refills | Status: DC

## 2017-08-19 NOTE — Patient Instructions (Addendum)
Increase Carvedilol to 12.5 mg twice a day  Increase lisinopril to 10 mg daily  Increase lasix to 80 mg in the morning and 40 mg in the afternoon.  Restrict your sodium intake.   I will see you in 3 months

## 2017-10-03 DIAGNOSIS — E869 Volume depletion, unspecified: Secondary | ICD-10-CM | POA: Diagnosis not present

## 2017-10-03 DIAGNOSIS — N201 Calculus of ureter: Secondary | ICD-10-CM

## 2017-10-03 DIAGNOSIS — D696 Thrombocytopenia, unspecified: Secondary | ICD-10-CM | POA: Diagnosis not present

## 2017-10-03 DIAGNOSIS — K7581 Nonalcoholic steatohepatitis (NASH): Secondary | ICD-10-CM

## 2017-10-03 DIAGNOSIS — N179 Acute kidney failure, unspecified: Secondary | ICD-10-CM

## 2017-10-04 DIAGNOSIS — N201 Calculus of ureter: Secondary | ICD-10-CM | POA: Diagnosis not present

## 2017-10-04 DIAGNOSIS — D696 Thrombocytopenia, unspecified: Secondary | ICD-10-CM | POA: Diagnosis not present

## 2017-10-04 DIAGNOSIS — R109 Unspecified abdominal pain: Secondary | ICD-10-CM

## 2017-10-04 DIAGNOSIS — E869 Volume depletion, unspecified: Secondary | ICD-10-CM | POA: Diagnosis not present

## 2017-10-04 DIAGNOSIS — K7581 Nonalcoholic steatohepatitis (NASH): Secondary | ICD-10-CM | POA: Diagnosis not present

## 2017-10-04 DIAGNOSIS — N179 Acute kidney failure, unspecified: Secondary | ICD-10-CM | POA: Diagnosis not present

## 2017-10-05 DIAGNOSIS — N179 Acute kidney failure, unspecified: Secondary | ICD-10-CM | POA: Diagnosis not present

## 2017-10-05 DIAGNOSIS — N201 Calculus of ureter: Secondary | ICD-10-CM | POA: Diagnosis not present

## 2017-10-05 DIAGNOSIS — D696 Thrombocytopenia, unspecified: Secondary | ICD-10-CM | POA: Diagnosis not present

## 2017-10-05 DIAGNOSIS — E869 Volume depletion, unspecified: Secondary | ICD-10-CM | POA: Diagnosis not present

## 2017-10-05 DIAGNOSIS — K7581 Nonalcoholic steatohepatitis (NASH): Secondary | ICD-10-CM | POA: Diagnosis not present

## 2017-10-06 DIAGNOSIS — E869 Volume depletion, unspecified: Secondary | ICD-10-CM | POA: Diagnosis not present

## 2017-10-06 DIAGNOSIS — N201 Calculus of ureter: Secondary | ICD-10-CM | POA: Diagnosis not present

## 2017-10-06 DIAGNOSIS — N179 Acute kidney failure, unspecified: Secondary | ICD-10-CM | POA: Diagnosis not present

## 2017-10-06 DIAGNOSIS — K7581 Nonalcoholic steatohepatitis (NASH): Secondary | ICD-10-CM | POA: Diagnosis not present

## 2017-10-07 DIAGNOSIS — K7581 Nonalcoholic steatohepatitis (NASH): Secondary | ICD-10-CM | POA: Diagnosis not present

## 2017-10-07 DIAGNOSIS — E869 Volume depletion, unspecified: Secondary | ICD-10-CM | POA: Diagnosis not present

## 2017-10-07 DIAGNOSIS — N201 Calculus of ureter: Secondary | ICD-10-CM | POA: Diagnosis not present

## 2017-10-07 DIAGNOSIS — N179 Acute kidney failure, unspecified: Secondary | ICD-10-CM | POA: Diagnosis not present

## 2017-10-08 DIAGNOSIS — K7581 Nonalcoholic steatohepatitis (NASH): Secondary | ICD-10-CM | POA: Diagnosis not present

## 2017-10-08 DIAGNOSIS — N201 Calculus of ureter: Secondary | ICD-10-CM | POA: Diagnosis not present

## 2017-10-08 DIAGNOSIS — N179 Acute kidney failure, unspecified: Secondary | ICD-10-CM | POA: Diagnosis not present

## 2017-10-08 DIAGNOSIS — E869 Volume depletion, unspecified: Secondary | ICD-10-CM | POA: Diagnosis not present

## 2017-10-09 DIAGNOSIS — N201 Calculus of ureter: Secondary | ICD-10-CM | POA: Diagnosis not present

## 2017-10-09 DIAGNOSIS — N179 Acute kidney failure, unspecified: Secondary | ICD-10-CM | POA: Diagnosis not present

## 2017-10-09 DIAGNOSIS — K7581 Nonalcoholic steatohepatitis (NASH): Secondary | ICD-10-CM | POA: Diagnosis not present

## 2017-10-09 DIAGNOSIS — E869 Volume depletion, unspecified: Secondary | ICD-10-CM | POA: Diagnosis not present

## 2017-11-11 HISTORY — PX: ESOPHAGOGASTRODUODENOSCOPY: SHX1529

## 2017-11-13 NOTE — Progress Notes (Deleted)
Cardiology Office Note   Date:  11/13/2017   ID:  Rebecca Jimenez, Rebecca Jimenez 08-09-1956, MRN [id number]  PCP:  Melony Overly, MD  Cardiologist:   Eulalie Speights Martinique, MD   No chief complaint on file.     History of Present Illness: Rebecca Jimenez is a 62 y.o. female who is seen for follow up  hypertensive heart disease and PVCs.  She has a history of DM, HTN, hepatic cirrhosis, and Sjogren's syndrome. She had remote cardiac cath in 2009 showing no significant CAD. Last evaluated in September 2017 for chest pain. Myoview showed no ischemia with EF 46%. Echo showed global HK with "borderline" LV dysfunction. She has a chronic LBBB. This information obtained from Dr. Andrey Campanile record.   On her last visit we increased her lisinopril and Coreg doses.   She is tolerating these well. Notes 22 lbs weight gain since then and feels she is retaining fluid. Notes some SOB when more active or bending over. Tries to watch salt intake. Has difficulty chewing food due to poor dentition. Dental work postponed due to low platelets.    Past Medical History:  Diagnosis Date  . Diabetes mellitus without complication ([place])   . Dry skin    Dry skin from sjorgrens  . Hypertension   . Irregular heart beat   . NASH (nonalcoholic steatohepatitis)   . Sjogren's syndrome ([place])   . Sleep apnea   . Stroke [facility])     Past Surgical History:  Procedure Laterality Date  . ABDOMINAL HYSTERECTOMY  1989  . BACK SURGERY  2000, 2007  . CHOLECYSTECTOMY    . COSMETIC SURGERY  1978   Face  . HAND SURGERY  1978, 1983  . INGUINAL HERNIA REPAIR Left   . KNEE SURGERY  2013  . ROTATOR CUFF REPAIR Left   . TONSILLECTOMY       Current Outpatient Medications  Medication Sig Dispense Refill  . carvedilol (COREG) 12.5 MG tablet Take 1 tablet (12.5 mg total) by mouth 2 (two) times daily. 180 tablet 3  . furosemide (LASIX) 40 MG tablet Take 1 tablet (40 mg total) by mouth 2 (two) times daily. Take 80 mg in the  morning and 40 mg in the afternoon 270 tablet 3  . lisinopril (PRINIVIL,ZESTRIL) 10 MG tablet Take 1 tablet (10 mg total) by mouth daily. 90 tablet 3  . rosuvastatin (CRESTOR) 10 MG tablet Take 1 tablet (10 mg total) by mouth daily. 90 tablet 3  . TRULICITY 9.15 AV/6.9VX SOPN Inject 1.5 mg as directed once a week.     No current facility-administered medications for this visit.     Allergies:   Adenosine; Codeine; Morphine and related; Penicillins; and Rocephin [ceftriaxone sodium in dextrose]    Social History:  The patient  reports that she quit smoking about 2 years ago. Her smoking use included cigarettes. She smoked 0.25 packs per day. she has never used smokeless tobacco. She reports that she drinks alcohol. She reports that she does not use drugs.   Family History:  The patient's family history includes Cancer - Prostate in her father; Cerebrovascular Accident in her father; Diabetes in her brother, father, and mother; Heart disease in her father.    ROS:  Please see the history of present illness.   Otherwise, review of systems are positive for none.   All other systems are reviewed and negative.    PHYSICAL EXAM: VS:  There were no vitals taken for this visit. ,  BMI There is no height or weight on file to calculate BMI. GENERAL:  Well appearing, morbidly obese WF  HEENT:  PERRL, EOMI, sclera are clear. Oropharynx is clear. Poor dentition with multiple caries. NECK:  No jugular venous distention, carotid upstroke brisk and symmetric, no bruits, no thyromegaly or adenopathy LUNGS:  Clear to auscultation bilaterally CHEST:  Unremarkable HEART:  RRR,  PMI not displaced or sustained,S1 and S2 within normal limits, no S3, no S4: no clicks, no rubs, no murmurs ABD:  Soft, nontender. BS +, no masses or bruits. No hepatomegaly, no splenomegaly EXT:  2 + pulses throughout, 1+ edema, no cyanosis no clubbing SKIN:  Warm and dry.  No rashes NEURO:  Alert and oriented x 3. Cranial nerves II  through XII intact. PSYCH:  Cognitively intact     EKG:  EKG is  Not ordered today. The ekg ordered today demonstrates N/A.    Recent Labs: No results found for requested labs within last 8760 hours.    Lipid Panel No results found for: CHOL, TRIG, HDL, CHOLHDL, VLDL, LDLCALC, LDLDIRECT    Wt Readings from Last 3 Encounters:  08/19/17 (!) 302 lb (137 kg)  05/10/17 280 lb 9.6 oz (127.3 kg)  02/16/13 286 lb (129.7 kg)      Other studies Reviewed: Additional studies/ records that were reviewed today include:  Labs dated 02/10/17: cholesterol 186, triglycerides 133, HDL 53, LDL 107.  Dated 03/11/17: normal chemistry panel. Dated 03/26/17: Hgb 12.9. Dated 06/14/17: cholesterol 195, triglycerides 107, HDL 73, LDL 101. A1c 6.2%. Hgb 12.6. plts 83K. Chemistries normal.TSH normal.  Dated 11/15/17: cholesterol 197, triglycerides 202. HDL 45, LDL 112. A1c 10%. Hgb 11.6. Potassium 3.3. Other chemistries normal.   ASSESSMENT AND PLAN:  1.  Nonischemic cardiomyopathy. LV function mildly impaired by Echo and Myoview. This may be related to hypertensive heart disease, DM, or LBBB with dyssynchrony. Recommend increasing Coreg to 12.5  mg bid and lisinopril to 10  mg daily. Strict Sodium restriction. Will increase lasix to 80 mg in the am and 40 mg in the pm.  2. HTN with hypertensive heart disease. As noted in #1.  3. Hypercholesterolemia. LDL 107 - improved to 101 after one month of Crestor. I will keep her on same dose given her cirrhosis.  4. PVCs. Mildly symptomatic. This is leading to some undercounting of her HR on pulse Ox. Asymptomatic. 5. Morbid obesity. Stressed importance of regular aerobic exercise and weight loss. 6. DM type 2- per primary care.  7. LBBB chronic 8. Poor dentition. 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 I would think she would be able to tolerate dental work with low risk of bleeding.    Current medicines are reviewed at  length with the patient today.  The patient does not have concerns regarding medicines.  The following changes have been made:  See above  Labs/ tests ordered today include: none No orders of the defined types were placed in this encounter.    Disposition:   FU with me in 3 months  Signed, Dauntae Derusha Martinique, MD  11/13/2017 7:48 AM    [facility] [address] [place], [place], [address] Phone [phone], Fax [phone]

## 2017-11-18 ENCOUNTER — Ambulatory Visit: Payer: Medicare PPO | Admitting: Cardiology

## 2017-12-27 ENCOUNTER — Ambulatory Visit: Payer: Medicare PPO | Admitting: Cardiology

## 2018-01-23 NOTE — Progress Notes (Signed)
Cardiology Office Note   Date:  01/26/2018   ID:  Rebecca Jimenez, Rebecca Jimenez November 09, 1955, MRN [id number]  PCP:  Patient, No Pcp Per  Cardiologist:   Rebecca Martinique, MD   Chief Complaint  Patient presents with  . Follow-up    3 months  . Shortness of Breath      History of Present Illness: Rebecca Jimenez is a 62 y.o. female who is seen for follow up  hypertensive heart disease and PVCs.  She has a history of DM, HTN, hepatic cirrhosis, and Sjogren's syndrome. She had remote cardiac cath in 2009 showing no significant CAD. Last evaluated in September 2017 for chest pain. Myoview showed no ischemia with EF 46%. Echo showed global HK with "borderline" LV dysfunction. She has a chronic LBBB. This information obtained from Dr. Andrey Campanile record.   On her last visit we started her back on lisinopril and Coreg at low dose. Also started Crestor. She is tolerating these well.   On follow up today she reports she was hospitalized at [facility] in January with sepsis, UTI, and kidney stones. Had ureteral stent placed and later removed. Reports platelet count as low as 36K. More recently has difficulty with diabetes control. A1c last week up to 12.9. Notes excessive thirst and increased HR. No increase in edema. Weight is down 15 lbs since November. Again unable to have dental extractions due to low plts.     Past Medical History:  Diagnosis Date  . Diabetes mellitus without complication ([place])   . Dry skin    Dry skin from sjorgrens  . Hypertension   . Irregular heart beat   . NASH (nonalcoholic steatohepatitis)   . Sjogren's syndrome ([employer])   . Sleep apnea   . Stroke [facility])     Past Surgical History:  Procedure Laterality Date  . ABDOMINAL HYSTERECTOMY  1989  . BACK SURGERY  2000, 2007  . CHOLECYSTECTOMY    . COSMETIC SURGERY  1978   Face  . HAND SURGERY  1978, 1983  . INGUINAL HERNIA REPAIR Left   . KNEE SURGERY  2013  . ROTATOR CUFF REPAIR Left   . TONSILLECTOMY        Current Outpatient Medications  Medication Sig Dispense Refill  . carvedilol (COREG) 12.5 MG tablet Take 1 tablet (12.5 mg total) by mouth 2 (two) times daily. 180 tablet 3  . furosemide (LASIX) 40 MG tablet Take 1 tablet (40 mg total) by mouth 2 (two) times daily. Take 80 mg in the morning and 40 mg in the afternoon 270 tablet 3  . rosuvastatin (CRESTOR) 10 MG tablet Take 1 tablet (10 mg total) by mouth daily. 90 tablet 3  . TRULICITY 3.97 QB/3.4LP SOPN Inject 1.5 mg as directed once a week.    [name] lisinopril (PRINIVIL,ZESTRIL) 10 MG tablet Take 1 tablet (10 mg total) by mouth daily. 90 tablet 3   No current facility-administered medications for this visit.     Allergies:   Adenosine; Codeine; Morphine and related; Penicillins; and Rocephin [ceftriaxone sodium in dextrose]    Social History:  The patient  reports that she quit smoking about 2 years ago. Her smoking use included cigarettes. She smoked 0.25 packs per day. She has never used smokeless tobacco. She reports that she drinks alcohol. She reports that she does not use drugs.   Family History:  The patient's family history includes Cancer - Prostate in her father; Cerebrovascular Accident in her father; Diabetes in her brother,  father, and mother; Heart disease in her father.    ROS:  Please see the history of present illness.   Otherwise, review of systems are positive for none.   All other systems are reviewed and negative.    PHYSICAL EXAM: VS:  BP 138/62 (BP Location: Left Arm, Patient Position: Sitting, Cuff Size: Large)   Pulse (!) 109   Ht 5' 3"  (1.6 m)   Wt 287 lb (130.2 kg)   BMI 50.84 kg/m  , BMI Body mass index is 50.84 kg/m. GENERAL:  Well appearing, morbidly obese WF  HEENT:  PERRL, EOMI, sclera are clear. Oropharynx is clear. Poor dentition with multiple caries. NECK:  No jugular venous distention, carotid upstroke brisk and symmetric, no bruits, no thyromegaly or adenopathy LUNGS:  Clear to auscultation  bilaterally CHEST:  Unremarkable HEART:  RRR,  PMI not displaced or sustained,S1 and S2 within normal limits, no S3, no S4: no clicks, no rubs, no murmurs ABD:  Soft, nontender. BS +, no masses or bruits. No hepatomegaly, no splenomegaly EXT:  2 + pulses throughout, tr-1+ edema, no cyanosis no clubbing SKIN:  Warm and dry.  No rashes NEURO:  Alert and oriented x 3. Cranial nerves II through XII intact. PSYCH:  Cognitively intact     EKG:  EKG is ordered today. The ekg ordered today demonstrates sinus tachy rate 109. LBBB. I have personally reviewed and interpreted this study.    Recent Labs: No results found for requested labs within last 8760 hours.    Lipid Panel No results found for: CHOL, TRIG, HDL, CHOLHDL, VLDL, LDLCALC, LDLDIRECT    Wt Readings from Last 3 Encounters:  01/26/18 287 lb (130.2 kg)  08/19/17 (!) 302 lb (137 kg)  05/10/17 280 lb 9.6 oz (127.3 kg)      Other studies Reviewed: Additional studies/ records that were reviewed today include:  Labs dated 02/10/17: cholesterol 186, triglycerides 133, HDL 53, LDL 107.  Dated 03/11/17: normal chemistry panel. Dated 03/26/17: Hgb 12.9.  Dated 06/14/17: cholesterol 195, triglycerides 107, HDL 73, LDL 101. A1c 6.2%. Hgb 12.6. plts 83K. Chemistries normal.TSH normal.  Dated 11/15/17: cholesterol 197, triglycerides 202, HDL 45, LDL 112. A1c 10.0%.  Dated 3.5.19: Hgb 12.1. Chemistries normal.  ASSESSMENT AND PLAN:  1.  Nonischemic cardiomyopathy. LV function mildly impaired by Echo and Myoview. This may be related to hypertensive heart disease, DM, or LBBB with dyssynchrony. Appears to be well compensated today on Coreg, lasix, lisinopril.  2. HTN with hypertensive heart disease. BP 3. Hypercholesterolemia. LDL 107 - improved to 101 after one month of Crestor. I will keep her on same dose given her cirrhosis.  4. PVCs. Mildly symptomatic. This is leading to some undercounting of her HR on pulse Ox. Asymptomatic. 5.  Morbid obesity. Stressed importance of regular aerobic exercise and weight loss. 6. DM type 2- per primary care.  7. LBBB chronic 8. Poor dentition. 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 I would think she would be able to tolerate dental work with low risk of bleeding.    Current medicines are reviewed at length with the patient today.  The patient does not have concerns regarding medicines.  The following changes have been made:  See above  Labs/ tests ordered today include: none No orders of the defined types were placed in this encounter.    Disposition:   FU with me in 3 months  Signed, Rebecca Martinique, MD  01/26/2018 2:31 PM  [facility] [address], [place], [place], [address] Phone [phone], Fax [phone]

## 2018-01-26 ENCOUNTER — Ambulatory Visit: Payer: Medicare PPO | Admitting: Cardiology

## 2018-01-26 ENCOUNTER — Encounter: Payer: Self-pay | Admitting: Cardiology

## 2018-01-26 VITALS — BP 138/62 | HR 109 | Ht 63.0 in | Wt 287.0 lb

## 2018-01-26 DIAGNOSIS — I10 Essential (primary) hypertension: Secondary | ICD-10-CM

## 2018-01-26 DIAGNOSIS — I428 Other cardiomyopathies: Secondary | ICD-10-CM | POA: Diagnosis not present

## 2018-01-26 DIAGNOSIS — I493 Ventricular premature depolarization: Secondary | ICD-10-CM | POA: Diagnosis not present

## 2018-01-26 DIAGNOSIS — I447 Left bundle-branch block, unspecified: Secondary | ICD-10-CM | POA: Diagnosis not present

## 2018-01-26 NOTE — Progress Notes (Incomplete)
Cardiology Office Note   Date:  01/26/2018   ID:  [name], [name] [birth year], MRN [id number]  PCP:  Patient, No Pcp Per  Cardiologist:   [name], MD   Chief Complaint  Patient presents with  . Follow-up    3 months  . Shortness of Breath      History of Present Illness: Rebecca Jimenez is a 62 y.o. female who is seen for follow up  hypertensive heart disease and PVCs.  She has a history of DM, HTN, hepatic cirrhosis, and Sjogren's syndrome. She had remote cardiac cath in 2009 showing no significant CAD. Last evaluated in September 2017 for chest pain. Myoview showed no ischemia with EF 46%. Echo showed global HK with "borderline" LV dysfunction. She has a chronic LBBB. This information obtained from Dr. Andrey Campanile record.   On her last visit we started her back on lisinopril and Coreg at low dose. Also started Crestor. She is tolerating these well.   On follow up today she reports she was hospitalized at [facility] in January with sepsis, UTI, and kidney stones. Had ureteral stent placed and later removed. Reports platelet count as low as 36K. More recently has difficulty with diabetes control. A1c last week up to 12.9. Notes excessive thirst and increased HR. No increase in edema. Weight is down 15 lbs since November. Again unable to have dental extractions due to low plts.     Past Medical History:  Diagnosis Date  . Diabetes mellitus without complication ([place])   . Dry skin    Dry skin from sjorgrens  . Hypertension   . Irregular heart beat   . NASH (nonalcoholic steatohepatitis)   . Sjogren's syndrome ([place])   . Sleep apnea   . Stroke [facility])     Past Surgical History:  Procedure Laterality Date  . ABDOMINAL HYSTERECTOMY  1989  . BACK SURGERY  2000, 2007  . CHOLECYSTECTOMY    . COSMETIC SURGERY  1978   Face  . HAND SURGERY  1978, 1983  . INGUINAL HERNIA REPAIR Left   . KNEE SURGERY  2013  . ROTATOR CUFF REPAIR Left   . TONSILLECTOMY        Current Outpatient Medications  Medication Sig Dispense Refill  . carvedilol (COREG) 12.5 MG tablet Take 1 tablet (12.5 mg total) by mouth 2 (two) times daily. 180 tablet 3  . furosemide (LASIX) 40 MG tablet Take 1 tablet (40 mg total) by mouth 2 (two) times daily. Take 80 mg in the morning and 40 mg in the afternoon 270 tablet 3  . rosuvastatin (CRESTOR) 10 MG tablet Take 1 tablet (10 mg total) by mouth daily. 90 tablet 3  . TRULICITY 4.27 CW/2.3JS SOPN Inject 1.5 mg as directed once a week.    [name] lisinopril (PRINIVIL,ZESTRIL) 10 MG tablet Take 1 tablet (10 mg total) by mouth daily. 90 tablet 3   No current facility-administered medications for this visit.     Allergies:   Adenosine; Codeine; Morphine and related; Penicillins; and Rocephin [ceftriaxone sodium in dextrose]    Social History:  The patient  reports that she quit smoking about 2 years ago. Her smoking use included cigarettes. She smoked 0.25 packs per day. She has never used smokeless tobacco. She reports that she drinks alcohol. She reports that she does not use drugs.   Family History:  The patient's family history includes Cancer - Prostate in her father; Cerebrovascular Accident in her father; Diabetes in her brother,  father, and mother; Heart disease in her father.    ROS:  Please see the history of present illness.   Otherwise, review of systems are positive for none.   All other systems are reviewed and negative.    PHYSICAL EXAM: VS:  BP 138/62 (BP Location: Left Arm, Patient Position: Sitting, Cuff Size: Large)   Pulse (!) 109   Ht 5' 3"  (1.6 m)   Wt 287 lb (130.2 kg)   BMI 50.84 kg/m  , BMI Body mass index is 50.84 kg/m. GENERAL:  Well appearing, morbidly obese WF  HEENT:  PERRL, EOMI, sclera are clear. Oropharynx is clear. Poor dentition with multiple caries. NECK:  No jugular venous distention, carotid upstroke brisk and symmetric, no bruits, no thyromegaly or adenopathy LUNGS:  Clear to auscultation  bilaterally CHEST:  Unremarkable HEART:  RRR,  PMI not displaced or sustained,S1 and S2 within normal limits, no S3, no S4: no clicks, no rubs, no murmurs ABD:  Soft, nontender. BS +, no masses or bruits. No hepatomegaly, no splenomegaly EXT:  2 + pulses throughout, tr-1+ edema, no cyanosis no clubbing SKIN:  Warm and dry.  No rashes NEURO:  Alert and oriented x 3. Cranial nerves II through XII intact. PSYCH:  Cognitively intact     EKG:  EKG is ordered today. The ekg ordered today demonstrates sinus tachy rate 109. LBBB. I have personally reviewed and interpreted this study.    Recent Labs: No results found for requested labs within last 8760 hours.    Lipid Panel No results found for: CHOL, TRIG, HDL, CHOLHDL, VLDL, LDLCALC, LDLDIRECT    Wt Readings from Last 3 Encounters:  01/26/18 287 lb (130.2 kg)  08/19/17 (!) 302 lb (137 kg)  05/10/17 280 lb 9.6 oz (127.3 kg)      Other studies Reviewed: Additional studies/ records that were reviewed today include:  Labs dated 02/10/17: cholesterol 186, triglycerides 133, HDL 53, LDL 107.  Dated 03/11/17: normal chemistry panel. Dated 03/26/17: Hgb 12.9.  Dated 06/14/17: cholesterol 195, triglycerides 107, HDL 73, LDL 101. A1c 6.2%. Hgb 12.6. plts 83K. Chemistries normal.TSH normal.  Dated 11/15/17: cholesterol 197, triglycerides 202, HDL 45, LDL 112. A1c 10.0%.  Dated 3.5.19: Hgb 12.1. Chemistries normal.  ASSESSMENT AND PLAN:  1.  Nonischemic cardiomyopathy. LV function mildly impaired by Echo and Myoview. This may be related to hypertensive heart disease, DM, or LBBB with dyssynchrony. Appears to be well compensated today on Coreg, lasix, lisinopril.  2. HTN with hypertensive heart disease. BP is well controlled. 3. Hypercholesterolemia. On crestor.  4. PVCs. Mildly symptomatic. Symptoms improved today 5. Morbid obesity. Stressed importance of regular aerobic exercise and weight loss. 6. DM type 2- poorly controlled. I think her  increased HR related to this and her thirst as well. Continue to work with primary care to control.  7. LBBB chronic 8. Poor dentition. Low platelet count has held up dental care.     Current medicines are reviewed at length with the patient today.  The patient does not have concerns regarding medicines.  The following changes have been made:  See above  Labs/ tests ordered today include: none No orders of the defined types were placed in this encounter.    Disposition:   FU with me in 6 months  Signed, [name], MD  01/26/2018 2:31 PM    [facility] [address], [place], [place], [address] Phone [phone], Fax [phone]

## 2018-01-26 NOTE — Patient Instructions (Addendum)
Continue your current therapy  Continue to work with Dr. Oleta Mouse to get your blood sugars under control  I will see you in 6 months.

## 2018-01-26 NOTE — Addendum Note (Signed)
Addended by: Venetia Maxon on: 01/26/2018 02:44 PM   Modules accepted: Orders

## 2018-04-29 DIAGNOSIS — R161 Splenomegaly, not elsewhere classified: Secondary | ICD-10-CM | POA: Insufficient documentation

## 2018-06-15 DIAGNOSIS — E559 Vitamin D deficiency, unspecified: Secondary | ICD-10-CM | POA: Insufficient documentation

## 2018-06-17 DIAGNOSIS — D696 Thrombocytopenia, unspecified: Secondary | ICD-10-CM | POA: Insufficient documentation

## 2018-06-29 DIAGNOSIS — D6959 Other secondary thrombocytopenia: Secondary | ICD-10-CM | POA: Diagnosis not present

## 2018-06-29 DIAGNOSIS — R161 Splenomegaly, not elsewhere classified: Secondary | ICD-10-CM

## 2018-06-29 DIAGNOSIS — K7581 Nonalcoholic steatohepatitis (NASH): Secondary | ICD-10-CM | POA: Diagnosis not present

## 2018-11-02 ENCOUNTER — Ambulatory Visit: Admit: 2018-11-02 | Discharge: 2018-11-03 | Payer: MEDICARE

## 2018-11-02 DIAGNOSIS — K7460 Unspecified cirrhosis of liver: Secondary | ICD-10-CM

## 2018-11-02 DIAGNOSIS — Z1159 Encounter for screening for other viral diseases: Secondary | ICD-10-CM

## 2018-11-02 DIAGNOSIS — K7581 Nonalcoholic steatohepatitis (NASH): Principal | ICD-10-CM

## 2018-11-12 ENCOUNTER — Ambulatory Visit: Admit: 2018-11-12 | Discharge: 2018-11-13 | Payer: MEDICARE

## 2018-11-12 DIAGNOSIS — K7581 Nonalcoholic steatohepatitis (NASH): Principal | ICD-10-CM

## 2018-11-15 ENCOUNTER — Other Ambulatory Visit: Payer: Self-pay | Admitting: Transplant Hepatology

## 2018-11-15 DIAGNOSIS — K7460 Unspecified cirrhosis of liver: Secondary | ICD-10-CM

## 2018-11-15 DIAGNOSIS — Z1289 Encounter for screening for malignant neoplasm of other sites: Secondary | ICD-10-CM

## 2018-11-15 DIAGNOSIS — K7581 Nonalcoholic steatohepatitis (NASH): Secondary | ICD-10-CM

## 2018-11-26 ENCOUNTER — Ambulatory Visit
Admission: RE | Admit: 2018-11-26 | Discharge: 2018-11-26 | Disposition: A | Discharge status: Home / Self Care | Payer: Medicare PPO | Source: Ambulatory Visit | Attending: Transplant Hepatology | Admitting: Transplant Hepatology

## 2018-11-26 DIAGNOSIS — K7460 Unspecified cirrhosis of liver: Secondary | ICD-10-CM

## 2018-11-26 DIAGNOSIS — K7581 Nonalcoholic steatohepatitis (NASH): Secondary | ICD-10-CM

## 2018-11-26 DIAGNOSIS — Z1289 Encounter for screening for malignant neoplasm of other sites: Secondary | ICD-10-CM

## 2018-11-26 MED ORDER — GADOBENATE DIMEGLUMINE 529 MG/ML IV SOLN
20.0000 mL | Freq: Once | INTRAVENOUS | Status: AC | PRN
  Administered 2018-11-26: 20 mL via INTRAVENOUS

## 2018-11-28 ENCOUNTER — Encounter: Admit: 2018-11-28 | Discharge: 2018-11-28 | Payer: MEDICARE

## 2018-11-28 DIAGNOSIS — K7581 Nonalcoholic steatohepatitis (NASH): Principal | ICD-10-CM

## 2019-02-06 DIAGNOSIS — M79662 Pain in left lower leg: Secondary | ICD-10-CM

## 2019-02-06 DIAGNOSIS — R21 Rash and other nonspecific skin eruption: Secondary | ICD-10-CM

## 2019-02-06 DIAGNOSIS — M79672 Pain in left foot: Principal | ICD-10-CM

## 2019-02-06 MED ORDER — NYSTATIN 100,000 UNIT/GRAM TOPICAL POWDER
Freq: Four times a day (QID) | TOPICAL | 1 refills | 0 days | Status: CP
Start: 2019-02-06 — End: 2020-02-06

## 2019-02-06 MED ORDER — METRONIDAZOLE (BULK) POWDER: g | 0 refills | 0 days | Status: AC

## 2019-02-06 MED ORDER — DOXYCYCLINE HYCLATE 100 MG CAPSULE
ORAL_CAPSULE | Freq: Two times a day (BID) | ORAL | 0 refills | 0 days | Status: CP
Start: 2019-02-06 — End: 2019-02-16

## 2019-02-06 MED ORDER — METRONIDAZOLE (BULK) POWDER
0 refills | 0.00000 days | Status: CP
Start: 2019-02-06 — End: 2019-02-06

## 2019-02-07 ENCOUNTER — Ambulatory Visit
Admit: 2019-02-07 | Discharge: 2019-02-07 | Disposition: A | Discharge status: Home / Self Care | Payer: MEDICARE | Attending: Family Medicine

## 2019-02-07 ENCOUNTER — Emergency Department
Admit: 2019-02-07 | Discharge: 2019-02-07 | Disposition: A | Discharge status: Home / Self Care | Payer: MEDICARE | Attending: Family Medicine

## 2019-02-28 DIAGNOSIS — D6959 Other secondary thrombocytopenia: Secondary | ICD-10-CM | POA: Diagnosis not present

## 2019-02-28 DIAGNOSIS — R161 Splenomegaly, not elsewhere classified: Secondary | ICD-10-CM

## 2019-02-28 DIAGNOSIS — K7581 Nonalcoholic steatohepatitis (NASH): Secondary | ICD-10-CM

## 2019-05-03 ENCOUNTER — Ambulatory Visit: Admit: 2019-05-03 | Discharge: 2019-05-04 | Payer: MEDICARE

## 2019-05-03 DIAGNOSIS — K7460 Unspecified cirrhosis of liver: Principal | ICD-10-CM

## 2019-05-25 ENCOUNTER — Ambulatory Visit (INDEPENDENT_AMBULATORY_CARE_PROVIDER_SITE_OTHER): Payer: Medicare PPO | Admitting: Physician Assistant

## 2019-05-25 ENCOUNTER — Encounter: Payer: Self-pay | Admitting: Physician Assistant

## 2019-05-25 ENCOUNTER — Other Ambulatory Visit: Payer: Self-pay

## 2019-05-25 VITALS — BP 171/87 | HR 98 | Temp 96.8°F | Ht 63.0 in | Wt 315.0 lb

## 2019-05-25 DIAGNOSIS — I10 Essential (primary) hypertension: Secondary | ICD-10-CM | POA: Diagnosis not present

## 2019-05-25 DIAGNOSIS — E119 Type 2 diabetes mellitus without complications: Secondary | ICD-10-CM

## 2019-05-25 DIAGNOSIS — I428 Other cardiomyopathies: Secondary | ICD-10-CM

## 2019-05-25 DIAGNOSIS — Z79899 Other long term (current) drug therapy: Secondary | ICD-10-CM

## 2019-05-25 DIAGNOSIS — R600 Localized edema: Secondary | ICD-10-CM | POA: Diagnosis not present

## 2019-05-25 DIAGNOSIS — Z8673 Personal history of transient ischemic attack (TIA), and cerebral infarction without residual deficits: Secondary | ICD-10-CM

## 2019-05-25 DIAGNOSIS — E7800 Pure hypercholesterolemia, unspecified: Secondary | ICD-10-CM | POA: Diagnosis not present

## 2019-05-25 DIAGNOSIS — K7460 Unspecified cirrhosis of liver: Secondary | ICD-10-CM

## 2019-05-25 DIAGNOSIS — K7581 Nonalcoholic steatohepatitis (NASH): Secondary | ICD-10-CM

## 2019-05-25 MED ORDER — FUROSEMIDE 40 MG PO TABS: ORAL_TABLET | ORAL | 3 refills | Status: DC

## 2019-05-25 MED ORDER — CARVEDILOL 6.25 MG PO TABS: 6.2500 mg | ORAL_TABLET | Freq: Two times a day (BID) | ORAL | 3 refills | Status: DC

## 2019-05-25 MED ORDER — POTASSIUM CHLORIDE CRYS ER 20 MEQ PO TBCR: 20.0000 meq | EXTENDED_RELEASE_TABLET | Freq: Two times a day (BID) | ORAL | 3 refills | Status: DC

## 2019-05-25 NOTE — Progress Notes (Signed)
Cardiology Office Note    Date:  05/26/2019   ID:  Rebecca Jimenez, Rebecca Jimenez 1955/11/21, MRN [id number]  PCP:  Patient, No Pcp Per  Cardiologist:  Dr. Martinique  Chief Complaint  Patient presents with   other    6 mo f/u.SOB with any exercise, No CP, New swelling feet,legs, stomach and hands.Has increase in WT of about 30 lbs. Medications reviewed verbally,     History of Present Illness:  Rebecca Jimenez is a 63 y.o. female with PMH of Sjogren's syndrome, hypertension, DM 2, NASH cirrhosis followed by Dr. Manuella Ghazi of [facility] (confirmed on biopsy 2014), PVCs, NICM, obstructive sleep apnea and history of CVA.  She had a remote cardiac catheterization in 2009 which showed no significant CAD.  Myoview in September 2017 showed EF 46%, no ischemia.  Echocardiogram showed global hypokinesis with borderline LV dysfunction.  She also has a chronic left bundle branch block.  She had admission to [facility] with UTI, sepsis and a kidney stone.  Ureteral stent was placed and later removed.  She was noted to have thrombocytopenia with platelets as low as 36,000.  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 (She would require clearance from liver specialist and thrombopoietin receptor agonist such as Avatrombopag should she require any surgery in the future due to risk of bleeding) Patient was recently seen by her liver specialist at [facility] Dr. Manuella Ghazi who noted that she is quite stable from the [facility] cirrhosis perspective without any ascites or hepatic encephalopathy, however she has been having worsening shortness of breath and lower extremity edema which was felt to be related to nonischemic cardiomyopathy.  She was instructed to follow-up with cardiology service as soon as possible.  She has been followed by [facility] primary care for left lower extremity traumatic bulla and cellulitis that occurred after a leg abrasion.  Recent wound culture was  positive for Corynebacterium species.  She has been treated with a course of clindamycin.  Patient presents today for cardiology office visit.  She has gained close to 30 pounds recently.  She has 2+ pitting edema on the left, and 1+ pitting edema on the right.  She does have a red spot in the left lower extremity suggestive of cellulitis however I do think there is sign of volume overload.  Fortunately her lungs is clear even though she has been noticing some orthopnea and PND recently.  I recommend increasing her Lasix to 80 mg a.m. and 40 mg p.m.  Of note, she has self discontinued all of her blood pressure medication and cholesterol medication.  She will need a basic metabolic panel and a fasting lipid panel in 1 week.  If cholesterol still uncontrolled, I plan to restart her on the 10 mg Crestor.  I restarted her on 6.25 mg twice daily of carvedilol today.  In the future if her blood pressure remains elevated, we can consider restarting her lisinopril as well.   Past Medical History:  Diagnosis Date   Diabetes mellitus without complication ([place])    Dry skin    Dry skin from sjorgrens   Hypertension    Irregular heart beat    NASH (nonalcoholic steatohepatitis)    Sjogren's syndrome ([place])    Sleep apnea    Stroke [facility])     Past Surgical History:  Procedure Laterality Date   [place]  2000, 2007   CHOLECYSTECTOMY  [place]   Face   HAND SURGERY  1978, 1983   INGUINAL HERNIA REPAIR Left    KNEE SURGERY  2013   ROTATOR CUFF REPAIR Left    TONSILLECTOMY      Current Medications: Outpatient Medications Prior to Visit  Medication Sig Dispense Refill   furosemide (LASIX) 40 MG tablet Take 1 tablet (40 mg total) by mouth 2 (two) times daily. Take 80 mg in the morning and 40 mg in the afternoon 270 tablet 3   carvedilol (COREG) 12.5 MG tablet Take 1 tablet (12.5 mg total) by mouth 2 (two) times daily. 180 tablet 3     lisinopril (PRINIVIL,ZESTRIL) 10 MG tablet Take 1 tablet (10 mg total) by mouth daily. 90 tablet 3   rosuvastatin (CRESTOR) 10 MG tablet Take 1 tablet (10 mg total) by mouth daily. 90 tablet 3   TRULICITY 9.62 EZ/6.6QH SOPN Inject 1.5 mg as directed once a week.     No facility-administered medications prior to visit.      Allergies:   Adenosine, Codeine, Morphine and related, Penicillins, and Rocephin [ceftriaxone sodium in dextrose]   Social History   Socioeconomic History   Marital status: Married    Spouse name: [name]   Number of children: 2   Years of education: S. College   Highest education level: Not on file  Occupational History   Occupation: Disabled  Scientist, product/process development strain: Not on file   Food insecurity    Worry: Not on file    Inability: Not on file   Transportation needs    Medical: Not on file    Non-medical: Not on file  Tobacco Use   Smoking status: Former Smoker    Packs/day: 0.25    Types: Cigarettes    Quit date: 05/11/2015    Years since quitting: 4.0   Smokeless tobacco: Never Used  Substance and Sexual Activity   Alcohol use: Yes    Comment: Consumes alcohol occasionally   Drug use: No   Sexual activity: Not on file  Lifestyle   Physical activity    Days per week: Not on file    Minutes per session: Not on file   Stress: Not on file  Relationships   Social connections    Talks on phone: Not on file    Gets together: Not on file    Attends religious service: Not on file    Active member of club or organization: Not on file    Attends meetings of clubs or organizations: Not on file    Relationship status: Not on file  Other Topics Concern   Not on file  Social History Narrative   Patient lives at home with her Husband [name]). Patient is disabled. Patient has two children. Caffeine three cups. Left handed.      Family History:  The patient's family history includes Cancer - Prostate in her father;  Cerebrovascular Accident in her father; Diabetes in her brother, father, and mother; Heart disease in her father.   ROS:   Please see the history of present illness.    ROS All other systems reviewed and are negative.   PHYSICAL EXAM:   VS:  BP (!) 171/87 (BP Location: Right Arm, Patient Position: Sitting, Cuff Size: Large)    Pulse 98    Temp (!) 96.8 F (36 C) (Temporal)    Ht 5' 3"  (1.6 m)    Wt (!) 315 lb (142.9 kg)  SpO2 97%    BMI 55.80 kg/m    GEN: Well nourished, well developed, in no acute distress  HEENT: normal  Neck: no JVD, carotid bruits, or masses Cardiac: RRR; no murmurs, rubs, or gallops. 1-2+ edema  Respiratory:  clear to auscultation bilaterally, normal work of breathing GI: soft, nontender, nondistended, + BS MS: no deformity or atrophy  Skin: warm and dry, no rash Neuro:  Alert and Oriented x 3, Strength and sensation are intact Psych: euthymic mood, full affect  Wt Readings from Last 3 Encounters:  05/25/19 (!) 315 lb (142.9 kg)  01/26/18 287 lb (130.2 kg)  08/19/17 (!) 302 lb (137 kg)      Studies/Labs Reviewed:   EKG:  EKG is ordered today.  The ekg ordered today demonstrates normal sinus rhythm with left bundle branch block.  Recent Labs: No results found for requested labs within last 8760 hours.   Lipid Panel No results found for: CHOL, TRIG, HDL, CHOLHDL, VLDL, LDLCALC, LDLDIRECT  Additional studies/ records that were reviewed today include:   N/A   ASSESSMENT:    1. Leg edema   2. Hypercholesterolemia   3. Medication management   4. Essential hypertension   5. Liver cirrhosis secondary to NASH ([place])   6. Controlled type 2 diabetes mellitus without complication, without long-term current use of insulin ([place])   7. H/O: CVA (cerebrovascular accident)   8. NICM (nonischemic cardiomyopathy) ([place])      PLAN:  In order of problems listed above:  1. Leg edema: Recently seen by liver center at [facility]  She is also being treated for left  lower extremity cellulitis.  She has at least 1-2+ pitting edema bilaterally.  I recommended increase Lasix to 80 mg a.m. and 40 mg p.m.  Increase potassium to 20 meq twice daily.  2. Nonischemic cardiomyopathy: Previous EF 46% on last Myoview in 2017.  3. Hypertension: Blood pressure elevated today.  I recommend he increase carvedilol to 6.25 mg twice daily.  4. DM2: Managed by primary care provider  5. NASH cirrhosis: Followed by [facility] liver center  6. History of CVA: No recent recurrence.   Medication Adjustments/Labs and Tests Ordered: Current medicines are reviewed at length with the patient today.  Concerns regarding medicines are outlined above.  Medication changes, Labs and Tests ordered today are listed in the Patient Instructions below. Patient Instructions  Medication Instructions:  Almyra Deforest, PA has recommended making the following medication changes: 1. START Carvedilol 6.25 mg - take 1 tablet twice daily 2. INCREASE Furosemide - take 2 tablets (80 mg total) every morning and take 1 tablet (40 mg total) every evening 3. START Potassium 20 mEq - take 1 tablet twice daily  If you need a refill on your cardiac medications before your next appointment, please call your pharmacy.   Lab work: Your physician recommends that you return for lab work in 1 week - FASTING.  If you have labs (blood work) drawn today and your tests are completely normal, you will receive your results only by:  [place] (if you have MyChart) OR  A paper copy in the mail If you have any lab test that is abnormal or we need to change your treatment, we will call you to review the results.  Follow-Up: Isaac Laud recommends that you schedule a follow-up appointment in 2 weeks. This has been scheduled for Tuesday, June 06, 2019 at 8:15 am.    Hilbert Corrigan, [place]  05/26/2019 11:22 PM  [facility] [address], [place], [place]  [address] Phone: [phone]; Fax: [phone]

## 2019-05-25 NOTE — Patient Instructions (Signed)
Medication Instructions:  Almyra Deforest, PA has recommended making the following medication changes: 1. START Carvedilol 6.25 mg - take 1 tablet twice daily 2. INCREASE Furosemide - take 2 tablets (80 mg total) every morning and take 1 tablet (40 mg total) every evening 3. START Potassium 20 mEq - take 1 tablet twice daily  If you need a refill on your cardiac medications before your next appointment, please call your pharmacy.   Lab work: Your physician recommends that you return for lab work in 1 week - FASTING.  If you have labs (blood work) drawn today and your tests are completely normal, you will receive your results only by: [name] MyChart Message (if you have MyChart) OR . A paper copy in the mail If you have any lab test that is abnormal or we need to change your treatment, we will call you to review the results.  Follow-Up: Rebecca Jimenez recommends that you schedule a follow-up appointment in 2 weeks. This has been scheduled for Tuesday, June 06, 2019 at 8:15 am.

## 2019-06-02 LAB — BASIC METABOLIC PANEL
BUN/Creatinine Ratio: 16 (ref 12–28)
BUN: 16 mg/dL (ref 8–27)
CO2: 29 mmol/L (ref 20–29)
Calcium: 9.4 mg/dL (ref 8.7–10.3)
Chloride: 100 mmol/L (ref 96–106)
Creatinine, Ser: 1.03 mg/dL — ABNORMAL HIGH (ref 0.57–1.00)
GFR calc Af Amer: 68 mL/min/{1.73_m2} (ref >59)
GFR calc non Af Amer: 59 mL/min/{1.73_m2} — ABNORMAL LOW (ref >59)
Glucose: 215 mg/dL — ABNORMAL HIGH (ref 65–99)
Potassium: 3.3 mmol/L — ABNORMAL LOW (ref 3.5–5.2)
Sodium: 141 mmol/L (ref 134–144)

## 2019-06-02 LAB — LIPID PANEL
Chol/HDL Ratio: 3.4 ratio (ref 0.0–4.4)
Cholesterol, Total: 186 mg/dL (ref 100–199)
HDL: 55 mg/dL (ref >39)
LDL Chol Calc (NIH): 103 mg/dL — ABNORMAL HIGH (ref 0–99)
Triglycerides: 162 mg/dL — ABNORMAL HIGH (ref 0–149)
VLDL Cholesterol Cal: 28 mg/dL (ref 5–40)

## 2019-06-05 ENCOUNTER — Telehealth: Payer: Self-pay

## 2019-06-05 NOTE — Telephone Encounter (Addendum)
Left a voice message for the patient to give me a cal back for her recent lab results and medication change recommendations   ----- Message from Almyra Deforest, [place] sent at 06/02/2019  4:44 PM EDT ----- Renal function ok, potassium low. Recommend increase potassium to 22mq AM and 240m PM. Obtain Repeat BMET in 1-2 weeks. Cholesterol borderline elevated

## 2019-06-06 ENCOUNTER — Ambulatory Visit: Payer: Medicare PPO | Admitting: Physician Assistant

## 2019-06-06 ENCOUNTER — Telehealth: Payer: Self-pay | Admitting: Physician Assistant

## 2019-06-06 ENCOUNTER — Other Ambulatory Visit: Payer: Self-pay

## 2019-06-06 DIAGNOSIS — E875 Hyperkalemia: Secondary | ICD-10-CM

## 2019-06-06 NOTE — Progress Notes (Signed)
The patient has been notified of the result and and to take increase her Potassium to 40 meq in the mornings and 20 meq in the evenings she verbalized an understanding. Patient was also informed to have repeat labs in 1-2 weeks. Patient also informed me that she has had a weight loss of 20 lbs when she went to her orthopaedics appointment last week. Patient missed 06/06/2019 appointment due to thinking it was on a different day later in the week, she is rescheduled to another day and will be awaiting a call from [facility], PA-C   All questions (if any) were answered. Jacqulynn Cadet, [place] 06/06/2019 1:31 PM

## 2019-06-06 NOTE — Telephone Encounter (Signed)
I contacted the patient, she managed to lose 21 pounds.  She recently had a 30 pound weight gain however volume overload signs and symptom has significantly improved on the higher dose of diuretic.  She is due to have another repeat lab work this Friday or next Monday to check her renal function and electrolyte.  Overall she says she has been doing well.  I will follow-up on the lab work.

## 2019-06-12 ENCOUNTER — Other Ambulatory Visit: Payer: Self-pay

## 2019-06-12 DIAGNOSIS — E875 Hyperkalemia: Secondary | ICD-10-CM

## 2019-06-15 LAB — BASIC METABOLIC PANEL
BUN/Creatinine Ratio: 12 (ref 12–28)
BUN: 11 mg/dL (ref 8–27)
CO2: 25 mmol/L (ref 20–29)
Calcium: 9.9 mg/dL (ref 8.7–10.3)
Chloride: 102 mmol/L (ref 96–106)
Creatinine, Ser: 0.92 mg/dL (ref 0.57–1.00)
GFR calc Af Amer: 78 mL/min/{1.73_m2} (ref >59)
GFR calc non Af Amer: 68 mL/min/{1.73_m2} (ref >59)
Glucose: 352 mg/dL — ABNORMAL HIGH (ref 65–99)
Potassium: 3.7 mmol/L (ref 3.5–5.2)
Sodium: 138 mmol/L (ref 134–144)

## 2019-06-16 NOTE — Progress Notes (Signed)
The patient has been notified of the result and verbalized understanding.  All questions (if any) were answered. Rebecca Jimenez, [name] 06/16/2019 2:24 PM

## 2019-06-20 ENCOUNTER — Encounter: Payer: Self-pay | Admitting: Physician Assistant

## 2019-06-20 ENCOUNTER — Other Ambulatory Visit: Payer: Self-pay

## 2019-06-20 ENCOUNTER — Ambulatory Visit: Payer: Medicare PPO | Admitting: Physician Assistant

## 2019-06-20 VITALS — BP 139/69 | HR 87 | Temp 96.8°F | Ht 63.0 in | Wt 291.6 lb

## 2019-06-20 DIAGNOSIS — I10 Essential (primary) hypertension: Secondary | ICD-10-CM

## 2019-06-20 DIAGNOSIS — Z8673 Personal history of transient ischemic attack (TIA), and cerebral infarction without residual deficits: Secondary | ICD-10-CM

## 2019-06-20 DIAGNOSIS — E119 Type 2 diabetes mellitus without complications: Secondary | ICD-10-CM | POA: Diagnosis not present

## 2019-06-20 DIAGNOSIS — K7581 Nonalcoholic steatohepatitis (NASH): Secondary | ICD-10-CM | POA: Diagnosis not present

## 2019-06-20 DIAGNOSIS — R002 Palpitations: Secondary | ICD-10-CM

## 2019-06-20 DIAGNOSIS — K7460 Unspecified cirrhosis of liver: Secondary | ICD-10-CM

## 2019-06-20 MED ORDER — CARVEDILOL 12.5 MG PO TABS: 12.5000 mg | ORAL_TABLET | Freq: Two times a day (BID) | ORAL | 1 refills | Status: DC

## 2019-06-20 NOTE — Progress Notes (Signed)
Cardiology Office Note    Date:  06/20/2019   ID:  Rebecca Jimenez [birth year], MRN [id number]  PCP:  Patient, No Pcp Per  Cardiologist:  Dr. Martinique  Chief Complaint  Patient presents with  . Follow-up    seen for Dr. Martinique. Followup on leg swelling    History of Present Illness:  Rebecca Jimenez is a 63 y.o. female with PMH of Sjogren's syndrome, HTN, DM 2, NASH cirrhosis followed by Dr. Manuella Ghazi of [facility] (confirmed on biopsy 2014), PVCs, NICM, obstructive sleep apnea and history of CVA.  She had a remote cardiac catheterization in 2009 which showed no significant CAD.  Myoview in September 2017 showed EF 46%, no ischemia.  Echocardiogram showed global hypokinesis with borderline LV dysfunction.  She also has a chronic left bundle branch block.  She had admission to [facility] with UTI, sepsis and a kidney stone.  Ureteral stent was placed and later removed.  She was noted to have thrombocytopenia with platelets as low as 36,000.  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 (She would require clearance from liver specialist and thrombopoietin receptor agonist such as Avatrombopag should she require any surgery in the future due to risk of bleeding) Patient was recently seen by her liver specialist at [facility] Dr. Manuella Ghazi who noted that she is quite stable from the NASH cirrhosis perspective without any ascites or hepatic encephalopathy, however she has been having worsening shortness of breath and lower extremity edema which was felt to be related to nonischemic cardiomyopathy.  She was instructed to follow-up with cardiology service as soon as possible.  She has been followed by [facility] primary care for left lower extremity traumatic bulla and cellulitis that occurred after a leg abrasion.  Recent wound culture was positive for Corynebacterium species.  She has been treated with a course of clindamycin.  I last saw the  patient on 06/21/2019 at which time she had a 2+ pitting edema on the left and 1+ pitting edema on the right.  She also gained about 30 pounds recently.  I increased her Lasix to 80 mg a.m. and 40 mg p.m.  She also self discontinued all of her cholesterol and blood pressure medication.  I restarted her on 6.25 mg twice daily of carvedilol.  I also recommend a recheck her cholesterol and if uncontrolled, plan to restart her on 10 mg of Crestor.  Recheck cholesterol showed borderline elevated LDL of 103 and a triglyceride of 162.  Otherwise normal total cholesterol and HDL level.  I recommended diet and exercise at the time.  Repeat blood work obtained on 06/15/2019 showed a stable renal function and electrolyte after starting on the higher dose of diuretic.  Patient presents today for cardiology follow-up.  After I started her on the higher dose of the diuretic, she ended up losing 14 pounds.  On physical exam today, she does not have significant lower extremity edema.  She feels her breathing has improved slightly as well.  Since renal function is stable, I recommended continue on the current doses of diuretic.  She has been noticing occasional palpitation that last up to 5 minutes.  She has a history of PVC however her palpitation sounds longer than just PVCs.  For the time being I increased carvedilol to 12.5 mg twice daily for better blood pressure and rate control.  However if her palpitation worsens, I would recommend proceeding with a 14-day ZIO patch monitor.  Otherwise she has no significant chest discomfort.  She can follow-up with Dr. Martinique in 3 to 4 months, earlier if palpitation worsens.   Past Medical History:  Diagnosis Date  . Diabetes mellitus without complication ([place])   . Dry skin    Dry skin from sjorgrens  . Hypertension   . Irregular heart beat   . NASH (nonalcoholic steatohepatitis)   . Sjogren's syndrome ([place])   . Sleep apnea   . Stroke [facility])     Past Surgical History:   Procedure Laterality Date  . ABDOMINAL HYSTERECTOMY  1989  . BACK SURGERY  2000, 2007  . CHOLECYSTECTOMY    . COSMETIC SURGERY  1978   Face  . HAND SURGERY  1978, 1983  . INGUINAL HERNIA REPAIR Left   . KNEE SURGERY  2013  . ROTATOR CUFF REPAIR Left   . TONSILLECTOMY      Current Medications: Outpatient Medications Prior to Visit  Medication Sig Dispense Refill  . carvedilol (COREG) 6.25 MG tablet Take 1 tablet (6.25 mg total) by mouth 2 (two) times daily with a meal. 180 tablet 3  . furosemide (LASIX) 40 MG tablet Take 2 tablets (80 mg total) by mouth every morning AND 1 tablet (40 mg total) every evening. 270 tablet 3  . potassium chloride SA (K-DUR) 20 MEQ tablet Take 1 tablet (20 mEq total) by mouth 2 (two) times daily. 180 tablet 3   No facility-administered medications prior to visit.      Allergies:   Adenosine, Codeine, Morphine and related, Penicillins, and Rocephin [ceftriaxone sodium in dextrose]   Social History   Socioeconomic History  . Marital status: Married    Spouse name: [name]  . Number of children: 2  . Years of education: S. College  . Highest education level: Not on file  Occupational History  . Occupation: Disabled  Social Needs  . Financial resource strain: Not on file  . Food insecurity    Worry: Not on file    Inability: Not on file  . Transportation needs    Medical: Not on file    Non-medical: Not on file  Tobacco Use  . Smoking status: Former Smoker    Packs/day: 0.25    Types: Cigarettes    Quit date: 05/11/2015    Years since quitting: 4.1  . Smokeless tobacco: Never Used  Substance and Sexual Activity  . Alcohol use: Yes    Comment: Consumes alcohol occasionally  . Drug use: No  . Sexual activity: Not on file  Lifestyle  . Physical activity    Days per week: Not on file    Minutes per session: Not on file  . Stress: Not on file  Relationships  . Social Herbalist on phone: Not on file    Gets together: Not on  file    Attends religious service: Not on file    Active member of club or organization: Not on file    Attends meetings of clubs or organizations: Not on file    Relationship status: Not on file  Other Topics Concern  . Not on file  Social History Narrative   Patient lives at home with her Husband [name]). Patient is disabled. Patient has two children. Caffeine three cups. Left handed.      Family History:  The patient's family history includes Cancer - Prostate in her father; Cerebrovascular Accident in her father; Diabetes in her brother, father, and mother; Heart disease in her father.  ROS:   Please see the history of present illness.    ROS All other systems reviewed and are negative.   PHYSICAL EXAM:   VS:  BP 139/69   Pulse 87   Temp (!) 96.8 F (36 C)   Ht 5' 3"  (1.6 m)   Wt 291 lb 9.6 oz (132.3 kg)   SpO2 96%   BMI 51.65 kg/m    GEN: Well nourished, well developed, in no acute distress  HEENT: normal  Neck: no JVD, carotid bruits, or masses Cardiac: RRR; no murmurs, rubs, or gallops,no edema. LLE dressing Respiratory:  clear to auscultation bilaterally, normal work of breathing GI: soft, nontender, nondistended, + BS MS: no deformity or atrophy  Skin: warm and dry, no rash Neuro:  Alert and Oriented x 3, Strength and sensation are intact Psych: euthymic mood, full affect  Wt Readings from Last 3 Encounters:  06/20/19 291 lb 9.6 oz (132.3 kg)  05/25/19 (!) 315 lb (142.9 kg)  01/26/18 287 lb (130.2 kg)      Studies/Labs Reviewed:   EKG:  EKG is not ordered today.    Recent Labs: 06/15/2019: BUN 11; Creatinine, Ser 0.92; Potassium 3.7; Sodium 138   Lipid Panel    Component Value Date/Time   CHOL 186 06/01/2019 1036   TRIG 162 (H) 06/01/2019 1036   HDL 55 06/01/2019 1036   CHOLHDL 3.4 06/01/2019 1036   LDLCALC 103 (H) 06/01/2019 1036    Additional studies/ records that were reviewed today include:   N/A    ASSESSMENT:    1. Palpitations    2. Essential hypertension   3. Controlled type 2 diabetes mellitus without complication, without long-term current use of insulin ([place])   4. Liver cirrhosis secondary to NASH ([place])   5. H/O: CVA (cerebrovascular accident)      PLAN:  In order of problems listed above:  1. Palpitation: Increase carvedilol to 12.5 mg twice daily for better rate control.  If symptom recurs and become more frequent, I would recommend a 14-day ZIO patch monitor  2. Hypertension: Blood pressure remained mildly elevated however significantly improved compared to previous office visit.  I plan to increase carvedilol to 12.5 mg twice daily for both blood pressure control and rate control  3. DM2: Currently not on any medication, defer management to primary care provider  4. Liver NASH cirrhosis: Followed by Dr. Manuella Ghazi at [facility]  5. History of CVA: No recent recurrence    Medication Adjustments/Labs and Tests Ordered: Current medicines are reviewed at length with the patient today.  Concerns regarding medicines are outlined above.  Medication changes, Labs and Tests ordered today are listed in the Patient Instructions below. There are no Patient Instructions on file for this visit.   Hilbert Corrigan, [place]  06/20/2019 8:44 AM    [address], [place], [place]  [address] Phone: [phone]; Fax: [phone]

## 2019-06-20 NOTE — Patient Instructions (Signed)
Medication Instructions:   INCREASE Coreg 12.5 mg 2 times a day If you need a refill on your cardiac medications before your next appointment, please call your pharmacy.   Lab work: NONE ordered at this time of appointment   If you have labs (blood work) drawn today and your tests are completely normal, you will receive your results only by: [name] MyChart Message (if you have MyChart) OR . A paper copy in the mail If you have any lab test that is abnormal or we need to change your treatment, we will call you to review the results.  Testing/Procedures: NONE ordered at this time of appointment   Follow-Up: At [facility], you and your health needs are our priority.  As part of our continuing mission to provide you with exceptional heart care, we have created designated Provider Care Teams.  These Care Teams include your primary Cardiologist (physician) and Advanced Practice Providers (APPs -  Physician Assistants and Nurse Practitioners) who all work together to provide you with the care you need, when you need it. You will need a follow up appointment in 3-4 months with Rebecca Martinique, MD earlier if palpitations are more frequent  Any Other Special Instructions Will Be Listed Below (If Applicable).

## 2019-06-28 ENCOUNTER — Other Ambulatory Visit: Payer: Self-pay | Admitting: Transplant Hepatology

## 2019-06-28 DIAGNOSIS — K7581 Nonalcoholic steatohepatitis (NASH): Secondary | ICD-10-CM

## 2019-06-28 DIAGNOSIS — K7460 Unspecified cirrhosis of liver: Secondary | ICD-10-CM

## 2019-07-16 ENCOUNTER — Ambulatory Visit
Admission: RE | Admit: 2019-07-16 | Discharge: 2019-07-16 | Disposition: A | Discharge status: Home / Self Care | Payer: Medicare PPO | Source: Ambulatory Visit | Attending: Transplant Hepatology | Admitting: Transplant Hepatology

## 2019-07-16 ENCOUNTER — Other Ambulatory Visit: Payer: Self-pay

## 2019-07-16 DIAGNOSIS — K7581 Nonalcoholic steatohepatitis (NASH): Secondary | ICD-10-CM

## 2019-07-16 DIAGNOSIS — K7460 Unspecified cirrhosis of liver: Secondary | ICD-10-CM

## 2019-07-16 MED ORDER — GADOBENATE DIMEGLUMINE 529 MG/ML IV SOLN
20.0000 mL | Freq: Once | INTRAVENOUS | Status: AC | PRN
  Administered 2019-07-16: 20 mL via INTRAVENOUS

## 2019-09-06 NOTE — Progress Notes (Deleted)
Cardiology Office Note    Date:  09/06/2019   ID:  Rebecca, Jimenez 1955/10/10, MRN [id number]  PCP:  Patient, No Pcp Per  Cardiologist:  Dr. Martinique  No chief complaint on file.   History of Present Illness:  Rebecca Jimenez is a 63 y.o. female with PMH of Sjogren's syndrome, HTN, DM 2, NASH cirrhosis followed by Dr. Manuella Ghazi of [facility] (confirmed on biopsy 2014), PVCs, NICM, obstructive sleep apnea and history of CVA.  She had a remote cardiac catheterization in 2009 which showed no significant CAD.  Myoview in September 2017 showed EF 46%, no ischemia.  Echocardiogram showed global hypokinesis with borderline LV dysfunction.  She also has a chronic left bundle branch block.  She had admission to [facility] with UTI, sepsis and a kidney stone.  Ureteral stent was placed and later removed.  She was noted to have thrombocytopenia with platelets as low as 36,000.  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 (She would require clearance from liver specialist and thrombopoietin receptor agonist such as Avatrombopag should she require any surgery in the future due to risk of bleeding) Patient was recently seen by her liver specialist at [facility] Dr. Manuella Ghazi who noted that she is quite stable from the NASH cirrhosis perspective without any ascites or hepatic encephalopathy, however she has been having worsening shortness of breath and lower extremity edema which was felt to be related to nonischemic cardiomyopathy.  She was instructed to follow-up with cardiology service as soon as possible.  She has been followed by [facility] primary care for left lower extremity traumatic bulla and cellulitis that occurred after a leg abrasion.  Recent wound culture was positive for Corynebacterium species.  She has been treated with a course of clindamycin.  She was seen by Almyra Deforest PA-C on 06/21/2019 with pitting edema. She also gained about 30 pounds.  Lasix dose was increased. She was started on Coreg for HTN.  On higher dose of lasix she lost 14 lbs. LE edema resolved. She noted some palpitations and Coreg dose was increased further.    Past Medical History:  Diagnosis Date  . Diabetes mellitus without complication ([place])   . Dry skin    Dry skin from sjorgrens  . Hypertension   . Irregular heart beat   . NASH (nonalcoholic steatohepatitis)   . Sjogren's syndrome ([place])   . Sleep apnea   . Stroke [facility])     Past Surgical History:  Procedure Laterality Date  . ABDOMINAL HYSTERECTOMY  1989  . BACK SURGERY  2000, 2007  . CHOLECYSTECTOMY    . COSMETIC SURGERY  1978   Face  . HAND SURGERY  1978, 1983  . INGUINAL HERNIA REPAIR Left   . KNEE SURGERY  2013  . ROTATOR CUFF REPAIR Left   . TONSILLECTOMY      Current Medications: Outpatient Medications Prior to Visit  Medication Sig Dispense Refill  . carvedilol (COREG) 12.5 MG tablet Take 1 tablet (12.5 mg total) by mouth 2 (two) times daily with a meal. 180 tablet 1  . furosemide (LASIX) 40 MG tablet Take 2 tablets (80 mg total) by mouth every morning AND 1 tablet (40 mg total) every evening. 270 tablet 3  . potassium chloride SA (K-DUR) 20 MEQ tablet Take 1 tablet (20 mEq total) by mouth 2 (two) times daily. 180 tablet 3   No facility-administered medications prior to visit.     Allergies:  Adenosine, Codeine, Morphine and related, Penicillins, and Rocephin [ceftriaxone sodium in dextrose]   Social History   Socioeconomic History  . Marital status: Married    Spouse name: Rebecca Jimenez  . Number of children: 2  . Years of education: S. College  . Highest education level: Not on file  Occupational History  . Occupation: Disabled  Tobacco Use  . Smoking status: Former Smoker    Packs/day: 0.25    Types: Cigarettes    Quit date: 05/11/2015    Years since quitting: 4.3  . Smokeless tobacco: Never Used  Substance and Sexual Activity  . Alcohol use: Yes    Comment: Consumes  alcohol occasionally  . Drug use: No  . Sexual activity: Not on file  Other Topics Concern  . Not on file  Social History Narrative   Patient lives at home with her Husband Rebecca Jimenez). Patient is disabled. Patient has two children. Caffeine three cups. Left handed.    Social Determinants of Health   Financial Resource Strain:   . Difficulty of Paying Living Expenses: Not on file  Food Insecurity:   . Worried About Charity fundraiser in the Last Year: Not on file  . Ran Out of Food in the Last Year: Not on file  Transportation Needs:   . Lack of Transportation (Medical): Not on file  . Lack of Transportation (Non-Medical): Not on file  Physical Activity:   . Days of Exercise per Week: Not on file  . Minutes of Exercise per Session: Not on file  Stress:   . Feeling of Stress : Not on file  Social Connections:   . Frequency of Communication with Friends and Family: Not on file  . Frequency of Social Gatherings with Friends and Family: Not on file  . Attends Religious Services: Not on file  . Active Member of Clubs or Organizations: Not on file  . Attends Archivist Meetings: Not on file  . Marital Status: Not on file     Family History:  The patient's family history includes Cancer - Prostate in her father; Cerebrovascular Accident in her father; Diabetes in her brother, father, and mother; Heart disease in her father.   ROS:   Please see the history of present illness.    ROS All other systems reviewed and are negative.   PHYSICAL EXAM:   VS:  There were no vitals taken for this visit.   GEN: Well nourished, well developed, in no acute distress  HEENT: normal  Neck: no JVD, carotid bruits, or masses Cardiac: RRR; no murmurs, rubs, or gallops,no edema. LLE dressing Respiratory:  clear to auscultation bilaterally, normal work of breathing GI: soft, nontender, nondistended, + BS MS: no deformity or atrophy  Skin: warm and dry, no rash Neuro:  Alert and Oriented x  3, Strength and sensation are intact Psych: euthymic mood, full affect  Wt Readings from Last 3 Encounters:  06/20/19 291 lb 9.6 oz (132.3 kg)  05/25/19 (!) 315 lb (142.9 kg)  01/26/18 287 lb (130.2 kg)      Studies/Labs Reviewed:   EKG:  EKG is not ordered today.    Recent Labs: 06/15/2019: BUN 11; Creatinine, Ser 0.92; Potassium 3.7; Sodium 138   Lipid Panel    Component Value Date/Time   CHOL 186 06/01/2019 1036   TRIG 162 (H) 06/01/2019 1036   HDL 55 06/01/2019 1036   CHOLHDL 3.4 06/01/2019 1036   LDLCALC 103 (H) 06/01/2019 1036    Additional studies/ records that  were reviewed today include:   N/A    ASSESSMENT:    No diagnosis found.   PLAN:  In order of problems listed above:  1. Palpitation: Increase carvedilol to 12.5 mg twice daily for better rate control.  If symptom recurs and become more frequent, I would recommend a 14-day ZIO patch monitor  2. Hypertension: Blood pressure remained mildly elevated however significantly improved compared to previous office visit.  I plan to increase carvedilol to 12.5 mg twice daily for both blood pressure control and rate control  3. DM2: Currently not on any medication, defer management to primary care provider  4. Liver NASH cirrhosis: Followed by Dr. Manuella Ghazi at [facility]  5. History of CVA: No recent recurrence  6.   Edema     Medication Adjustments/Labs and Tests Ordered: Current medicines are reviewed at length with the patient today.  Concerns regarding medicines are outlined above.  Medication changes, Labs and Tests ordered today are listed in the Patient Instructions below. There are no Patient Instructions on file for this visit.   Signed, Carly Sabo Martinique, MD  09/06/2019 7:57 PM    [address], [place], [place]  [address] Phone: [phone]; Fax: [phone]

## 2019-09-08 ENCOUNTER — Ambulatory Visit: Payer: Medicare PPO | Admitting: Cardiology

## 2019-09-21 ENCOUNTER — Ambulatory Visit: Admit: 2019-09-21 | Discharge: 2019-09-21 | Disposition: A | Discharge status: Home / Self Care | Payer: MEDICARE

## 2019-09-21 DIAGNOSIS — N3000 Acute cystitis without hematuria: Principal | ICD-10-CM

## 2019-09-21 MED ORDER — CIPROFLOXACIN 500 MG TABLET
ORAL_TABLET | Freq: Two times a day (BID) | ORAL | 0 refills | 7 days | Status: CP
Start: 2019-09-21 — End: 2019-09-28

## 2019-09-22 NOTE — Progress Notes (Signed)
Cardiology Office Note    Date:  09/26/2019   ID:  [name], Rebecca Jimenez [birth year], MRN [id number]  PCP:  Rebecca Broker, MD  Cardiologist:  Dr. Martinique  Chief Complaint  Patient presents with  . Edema    History of Present Illness:  Rebecca Jimenez is a 64 y.o. female with PMH of Sjogren's syndrome, HTN, DM 2, NASH cirrhosis followed by Rebecca Jimenez of [facility] (confirmed on biopsy 2014), PVCs, NICM, obstructive sleep apnea and history of CVA.  She had a remote cardiac catheterization in 2009 which showed no significant CAD.  Myoview in September 2017 showed EF 46%, no ischemia.  Echocardiogram showed global hypokinesis with borderline LV dysfunction.  She also has a chronic left bundle branch block.  She had admission to [facility] with UTI, sepsis and a kidney stone.  Ureteral stent was placed and later removed.  She was noted to have thrombocytopenia with platelets as low as 36,000.  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 (She would require clearance from liver specialist and thrombopoietin receptor agonist such as Avatrombopag should she require any surgery in the future due to risk of bleeding) She was seen in the fall of 2020  by her liver specialist at [facility] Rebecca Jimenez who noted that she is quite stable from the NASH cirrhosis perspective without any ascites or hepatic encephalopathy, however she has been having worsening shortness of breath and lower extremity edema which was felt to be related to nonischemic cardiomyopathy.   She was seen by Rebecca Deforest PA-C on 05/25/2019 with pitting edema. She also gained about 30 pounds. Lasix dose was increased to 80 mg in the am and 40 mg in the pm. She was started on Coreg for HTN and this was later increased due to palpitations.  On higher dose of lasix she has now lost 40 lbs. She has felt much better. She only gets SOB now if she walks a long way such as shopping. Her palpitations  are also much better with only one episode she can think of.     Past Medical History:  Diagnosis Date  . Diabetes mellitus without complication ([place])   . Dry skin    Dry skin from sjorgrens  . Hypertension   . Irregular heart beat   . NASH (nonalcoholic steatohepatitis)   . Sjogren's syndrome ([place])   . Sleep apnea   . Stroke [facility])     Past Surgical History:  Procedure Laterality Date  . ABDOMINAL HYSTERECTOMY  1989  . BACK SURGERY  2000, 2007  . CHOLECYSTECTOMY    . COSMETIC SURGERY  1978   Face  . ESOPHAGOGASTRODUODENOSCOPY  11/11/2017   Grade 1 esophageal varices (too small for EVL). Moderate portal hypertensive gastropathy. Otherwise, normal EGD  . HAND SURGERY  1978, 1983  . INGUINAL HERNIA REPAIR Left   . KNEE SURGERY  2013  . ROTATOR CUFF REPAIR Left   . TONSILLECTOMY      Current Medications: Outpatient Medications Prior to Visit  Medication Sig Dispense Refill  . carvedilol (COREG) 12.5 MG tablet Take 1 tablet (12.5 mg total) by mouth 2 (two) times daily with a meal. 180 tablet 1  . furosemide (LASIX) 40 MG tablet Take 2 tablets (80 mg total) by mouth every morning AND 1 tablet (40 mg total) every evening. 270 tablet 3  . potassium chloride SA (K-DUR) 20 MEQ tablet Take 1 tablet (20 mEq total) by mouth 2 (  two) times daily. 180 tablet 3   No facility-administered medications prior to visit.     Allergies:   Adenosine, Codeine, Morphine and related, Penicillins, and Rocephin [ceftriaxone sodium in dextrose]   Social History   Socioeconomic History  . Marital status: Married    Spouse name: Rebecca Jimenez  . Number of children: 2  . Years of education: S. College  . Highest education level: Not on file  Occupational History  . Occupation: Disabled  Tobacco Use  . Smoking status: Former Smoker    Packs/day: 0.25    Types: Cigarettes    Quit date: 05/11/2015    Years since quitting: 4.3  . Smokeless tobacco: Never Used  Substance and Sexual Activity  . Alcohol  use: Yes    Comment: Consumes alcohol occasionally  . Drug use: No  . Sexual activity: Not on file  Other Topics Concern  . Not on file  Social History Narrative   Patient lives at home with her Husband Rebecca Jimenez). Patient is disabled. Patient has two children. Caffeine three cups. Left handed.    Social Determinants of Health   Financial Resource Strain:   . Difficulty of Paying Living Expenses: Not on file  Food Insecurity:   . Worried About Charity fundraiser in the Last Year: Not on file  . Ran Out of Food in the Last Year: Not on file  Transportation Needs:   . Lack of Transportation (Medical): Not on file  . Lack of Transportation (Non-Medical): Not on file  Physical Activity:   . Days of Exercise per Week: Not on file  . Minutes of Exercise per Session: Not on file  Stress:   . Feeling of Stress : Not on file  Social Connections:   . Frequency of Communication with Friends and Family: Not on file  . Frequency of Social Gatherings with Friends and Family: Not on file  . Attends Religious Services: Not on file  . Active Member of Clubs or Organizations: Not on file  . Attends Archivist Meetings: Not on file  . Marital Status: Not on file     Family History:  The patient's family history includes Cancer - Prostate in her father; Cerebrovascular Accident in her father; Diabetes in her brother, father, and mother; Heart disease in her father.   ROS:   Please see the history of present illness.    ROS All other systems reviewed and are negative.   PHYSICAL EXAM:   VS:  BP 135/73   Pulse 93   Temp 97.9 F (36.6 C)   Ht 5' 3"  (1.6 m)   Wt 275 lb 6.4 oz (124.9 kg)   SpO2 93%   BMI 48.78 kg/m    GEN: Well nourished, well developed, in no acute distress  HEENT: normal  Neck: no JVD, carotid bruits, or masses Cardiac: RRR; no murmurs, rubs, or gallops,no edema- brawny skin changes in LE. Respiratory:  clear to auscultation bilaterally, normal work of  breathing GI: soft, nontender, nondistended, + BS MS: no deformity or atrophy  Skin: warm and dry, no rash Neuro:  Alert and Oriented x 3, Strength and sensation are intact Psych: euthymic mood, full affect  Wt Readings from Last 3 Encounters:  09/26/19 275 lb 6.4 oz (124.9 kg)  06/20/19 291 lb 9.6 oz (132.3 kg)  05/25/19 (!) 315 lb (142.9 kg)      Studies/Labs Reviewed:   EKG:  EKG is not ordered today.    Recent Labs: 06/15/2019: BUN  11; Creatinine, Ser 0.92; Potassium 3.7; Sodium 138   Lipid Panel    Component Value Date/Time   CHOL 186 06/01/2019 1036   TRIG 162 (H) 06/01/2019 1036   HDL 55 06/01/2019 1036   CHOLHDL 3.4 06/01/2019 1036   LDLCALC 103 (H) 06/01/2019 1036    Additional studies/ records that were reviewed today include:   N/A    ASSESSMENT:    No diagnosis found.   PLAN:  In order of problems listed above:  1. Palpitation: improved with increase in  carvedilol to 12.5 mg twice daily. Continue current therapy  2. Hypertension: Blood pressure control has improved significantly with diuresis and increased Coreg. Continue.   3. DM2: Currently not on any medication, defer management to primary care provider. Patient is going to call for follow up  4. Liver NASH cirrhosis: Followed by Rebecca Jimenez at [facility]  5. History of CVA: No recent recurrence  6.   Edema markedly improved. Patient will have renal panel done with primary care when she sees them. Continue current lasix dose. Sodium restriction.  Follow up in 6 months.    Medication Adjustments/Labs and Tests Ordered: Current medicines are reviewed at length with the patient today.  Concerns regarding medicines are outlined above.  Medication changes, Labs and Tests ordered today are listed in the Patient Instructions below. There are no Patient Instructions on file for this visit.   Signed, Jacari Iannello Martinique, MD  09/26/2019 5:14 PM    [facility] [address], [place],  [place]  [address] Phone: [phone]; Fax: [phone]

## 2019-09-26 ENCOUNTER — Encounter: Payer: Self-pay | Admitting: Cardiology

## 2019-09-26 ENCOUNTER — Other Ambulatory Visit: Payer: Self-pay

## 2019-09-26 ENCOUNTER — Ambulatory Visit: Payer: Medicare PPO | Admitting: Cardiology

## 2019-09-26 VITALS — BP 135/73 | HR 93 | Temp 97.9°F | Ht 63.0 in | Wt 275.4 lb

## 2019-09-26 DIAGNOSIS — I10 Essential (primary) hypertension: Secondary | ICD-10-CM | POA: Diagnosis not present

## 2019-09-26 DIAGNOSIS — I428 Other cardiomyopathies: Secondary | ICD-10-CM | POA: Diagnosis not present

## 2019-09-26 DIAGNOSIS — R600 Localized edema: Secondary | ICD-10-CM

## 2019-09-26 DIAGNOSIS — K7581 Nonalcoholic steatohepatitis (NASH): Secondary | ICD-10-CM | POA: Diagnosis not present

## 2019-09-26 DIAGNOSIS — K7460 Unspecified cirrhosis of liver: Secondary | ICD-10-CM

## 2019-11-04 ENCOUNTER — Ambulatory Visit: Admit: 2019-11-04 | Discharge: 2019-11-09 | Disposition: A | Discharge status: Home / Self Care | Payer: MEDICARE

## 2019-11-09 MED ORDER — GLIPIZIDE ER 5 MG TABLET, EXTENDED RELEASE 24 HR: 5 mg | tablet | Freq: Every day | 0 refills | 30 days

## 2019-11-09 MED ORDER — GLIPIZIDE ER 5 MG TABLET, EXTENDED RELEASE 24 HR
ORAL_TABLET | Freq: Every day | ORAL | 0 refills | 30.00000 days
Start: 2019-11-09 — End: 2019-12-09

## 2019-11-09 MED ORDER — LOSARTAN 25 MG TABLET: 25 mg | tablet | Freq: Every day | 0 refills | 30 days | Status: AC

## 2019-11-09 MED ORDER — ATORVASTATIN 40 MG TABLET: 40 mg | tablet | Freq: Every day | 0 refills | 30 days | Status: AC

## 2019-11-09 MED ORDER — ATORVASTATIN 40 MG TABLET
ORAL_TABLET | Freq: Every day | ORAL | 0 refills | 30.00000 days | Status: CP
Start: 2019-11-09 — End: 2019-11-09

## 2019-11-10 MED ORDER — LOSARTAN 25 MG TABLET
ORAL_TABLET | Freq: Every day | ORAL | 0 refills | 30.00000 days | Status: CP
Start: 2019-11-10 — End: 2019-11-09

## 2019-11-27 ENCOUNTER — Ambulatory Visit: Admit: 2019-11-27 | Discharge: 2019-11-28 | Payer: MEDICARE

## 2019-11-27 DIAGNOSIS — K7460 Unspecified cirrhosis of liver: Principal | ICD-10-CM

## 2019-11-29 NOTE — Progress Notes (Signed)
Virtual Visit via Telephone Note   This visit type was conducted due to national recommendations for restrictions regarding the COVID-19 Pandemic (e.g. social distancing) in an effort to limit this patient's exposure and mitigate transmission in our community.  Due to her co-morbid illnesses, this patient is at least at moderate risk for complications without adequate follow up.  This format is felt to be most appropriate for this patient at this time.  The patient did not have access to video technology/had technical difficulties with video requiring transitioning to audio format only (telephone).  All issues noted in this document were discussed and addressed.  No physical exam could be performed with this format.  Please refer to the patient's chart for her  consent to telehealth for [facility].   The patient was identified using 2 identifiers.  Date:  11/29/2019   ID:  Rebecca Jimenez, DOB 04-17-1956, MRN [id number]  Patient Location: Home Provider Location: Home  PCP:  Myrlene Broker, MD  Cardiologist:  Dieter Hane Martinique, MD  Electrophysiologist:  None   Evaluation Performed:  Follow-Up Visit  Chief Complaint:  edema  History of Present Illness:    Rebecca Jimenez is a 64 y.o. female with PMH ofSjogren's syndrome, HTN, DM 2,NASHcirrhosis [facility](confirmed on biopsy 2014), PVCs,NICM,obstructive sleep apnea and history of CVA. She had a remote cardiac catheterization in 2009 which showed no significant CAD. Myoview in September 2017 showed EF 46%, no ischemia. Echocardiogram showed global hypokinesis with borderline LV dysfunction. She also has a chronic left bundle branch block. She had admission to [facility] with UTI, sepsis and a kidney stone. Ureteral stent was placed and later removed. She was noted to have thrombocytopenia with platelets as low as 36,000. 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 (She would require clearance from liver specialist and thrombopoietin receptoragonist such as Avatrombopagshould she require any surgery in the future due to risk of bleeding) She was seen in the fall of 2020  by her liver specialist at [place] noted that she is quite stable from the NASH cirrhosis perspective without any ascites or hepatic encephalopathy, however she has been having worsening shortness of breath and lower extremity edema which was felt to be related to nonischemic cardiomyopathy.   She was seen by Almyra Deforest PA-C on 05/25/2019 with pitting edema. She also gained about 30 pounds. Lasix dose was increased to 80 mg in the am and 40 mg in the pm. She was started on Coreg for HTN and this was later increased due to palpitations.  On higher dose of lasix she has now lost 40 lbs. When seen in January she was doing well.   She was admitted to [facility] in early February with Covid 19 and PNA. Treated with oxygen. Inflammatory markers were high but trending down. BNP 339. Troponin levels normal. Echo done showing EF 35-40%. CT chest and CXR showed diffuse pulmonary infiltrates. She had some ascites.   On follow up she feels SOB and has lack of energy with any activity. Feels chest tightness. She has gained 10 lbs since hospital DC. She saw her liver doctor this week who felt she was volume overloaded but no medication changes made. She feels her HR is faster than normal. She is less responsive to the diuretics and notes more swelling. She did not go home on oxygen.  The patient has had recent  COVID-19 infection as noted above.  Past Medical History:  Diagnosis Date  . Diabetes mellitus without complication ([place])   . Dry skin    Dry skin from sjorgrens  . Hypertension   . Irregular heart beat   . NASH (nonalcoholic steatohepatitis)   . Sjogren's syndrome ([place])   . Sleep apnea   . Stroke [facility])    Past Surgical History:  Procedure  Laterality Date  . ABDOMINAL HYSTERECTOMY  1989  . BACK SURGERY  2000, 2007  . CHOLECYSTECTOMY    . COSMETIC SURGERY  1978   Face  . ESOPHAGOGASTRODUODENOSCOPY  11/11/2017   Grade 1 esophageal varices (too small for EVL). Moderate portal hypertensive gastropathy. Otherwise, normal EGD  . HAND SURGERY  1978, 1983  . INGUINAL HERNIA REPAIR Left   . KNEE SURGERY  2013  . ROTATOR CUFF REPAIR Left   . TONSILLECTOMY       No outpatient medications have been marked as taking for the 11/30/19 encounter (Appointment) with Martinique, Alyxis Grippi M, MD.     Allergies:   Adenosine, Codeine, Morphine and related, Penicillins, and Rocephin [ceftriaxone sodium in dextrose]   Social History   Tobacco Use  . Smoking status: Former Smoker    Packs/day: 0.25    Types: Cigarettes    Quit date: 05/11/2015    Years since quitting: 4.5  . Smokeless tobacco: Never Used  Substance Use Topics  . Alcohol use: Yes    Comment: Consumes alcohol occasionally  . Drug use: No     Family Hx: The patient's family history includes Cancer - Prostate in her father; Cerebrovascular Accident in her father; Diabetes in her brother, father, and mother; Heart disease in her father.  ROS:   Please see the history of present illness.    All other systems reviewed and are negative.   Prior CV studies:   The following studies were reviewed today:  Echo 11/08/19: Summary  1. The left ventricle is mildly to moderately dilated in size with normal wall thickness.  2. The left ventricular systolic function is moderately decreased, LVEF is visually estimated at 35-40%.  3. There is grade II diastolic dysfunction (elevated filling pressure).  4. The left atrium is mildly dilated in size.  5. The right ventricle is normal in size, with normal systolic function.  6. There is no pulmonary hypertension, estimated pulmonary artery systolic pressure is 14 mmHg.   CT ANGIOGRAPHY CHEST WITH  CONTRAST  TECHNIQUE: Multidetector CT imaging of the chest was performed using the standard protocol during bolus administration of intravenous contrast. Multiplanar CT image reconstructions and MIPs were obtained to evaluate the vascular anatomy.  CONTRAST: 76 mL Omnipaque 350.  COMPARISON: Chest x-ray from earlier in the same day.  FINDINGS: Cardiovascular: Thoracic aorta shows atherosclerotic calcifications without aneurysmal dilatation or dissection. Cardiac enlargement is seen. Coronary calcifications are noted. The pulmonary artery shows a normal branching pattern without intraluminal filling defect to suggest pulmonary embolism.  Mediastinum/Nodes: Thoracic inlet is within normal limits. Scattered small hilar and mediastinal lymph nodes are identified and likely of a reactive nature. The esophagus as visualized is within normal limits.  Lungs/Pleura: Lungs are well aerated bilaterally but demonstrate diffuse bilateral ground-glass opacities similar to that seen on recent chest x-ray consistent with the given clinical history of COVID-19 positivity. No sizable parenchymal nodule or effusion is noted.  Upper Abdomen: Visualized upper abdomen shows very mild ascites. Nodularity of the liver is noted with mild splenomegaly suggestive of cirrhosis.  Musculoskeletal: Degenerative changes of the thoracic spine are  noted. No acute rib abnormality is noted.  Review of the MIP images confirms the above findings.  Other Result Information  Interface, Rad Results In - 11/05/2019  2:06 PM EST CLINICAL DATA:  COVID-19 positivity with pleuritic chest pain, initial encounter  EXAM: CT ANGIOGRAPHY CHEST WITH CONTRAST  TECHNIQUE: Multidetector CT imaging of the chest was performed using the standard protocol during bolus administration of intravenous contrast. Multiplanar CT image reconstructions and MIPs were obtained to evaluate the vascular anatomy.  CONTRAST:  76 mL  Omnipaque 350.  COMPARISON:  Chest x-ray from earlier in the same day.  FINDINGS: Cardiovascular: Thoracic aorta shows atherosclerotic calcifications without aneurysmal dilatation or dissection. Cardiac enlargement is seen. Coronary calcifications are noted. The pulmonary artery shows a normal branching pattern without intraluminal filling defect to suggest pulmonary embolism.  Mediastinum/Nodes: Thoracic inlet is within normal limits. Scattered small hilar and mediastinal lymph nodes are identified and likely of a reactive nature. The esophagus as visualized is within normal limits.  Lungs/Pleura: Lungs are well aerated bilaterally but demonstrate diffuse bilateral ground-glass opacities similar to that seen on recent chest x-ray consistent with the given clinical history of COVID-19 positivity. No sizable parenchymal nodule or effusion is noted.  Upper Abdomen: Visualized upper abdomen shows very mild ascites. Nodularity of the liver is noted with mild splenomegaly suggestive of cirrhosis.  Musculoskeletal: Degenerative changes of the thoracic spine are noted. No acute rib abnormality is noted.  Review of the MIP images confirms the above findings.  IMPRESSION: Changes consistent with the history of COVID-19 positivity.  No evidence of pulmonary emboli.  Mild changes of cirrhosis with ascites.  Aortic Atherosclerosis (ICD10-I70.0).   Electronically Signed   By: Inez Catalina M.D.   On: 02/14   PORTABLE CHEST 1 VIEW  COMPARISON: 06/11/2016, 09/12/2017  FINDINGS: Cardiomegaly. Mild central vascular congestion. Diffuse bilateral patchy consolidations consistent with bilateral pneumonia. No pleural effusion or pneumothorax. Aortic atherosclerosis. Stable calcified nodules at the right apex.  Other Result Information  Interface, Rad Results In - 11/04/2019  4:10 PM EST CLINICAL DATA:  Chest pain  EXAM: PORTABLE CHEST 1 VIEW  COMPARISON:  06/11/2016,  09/12/2017  FINDINGS: Cardiomegaly. Mild central vascular congestion. Diffuse bilateral patchy consolidations consistent with bilateral pneumonia. No pleural effusion or pneumothorax. Aortic atherosclerosis. Stable calcified nodules at the right apex.  IMPRESSION: 1. Fairly extensive bilateral patchy consolidations, felt most suspicious for bilateral pneumonia given clinical history of COVID positivity. 2. Cardiomegaly with mild central vascular congestion.   Electronically Signed   By: Donavan Foil M.D.   On: 11/04/2019 16:08     Labs/Other Tests and Data Reviewed:    EKG:  No ECG reviewed. [facility]g in [facility] showed NSR with rate 86 and LBBB.   Recent Labs: 06/15/2019: BUN 11; Creatinine, Ser 0.92; Potassium 3.7; Sodium 138   Recent Lipid Panel Lab Results  Component Value Date/Time   CHOL 186 06/01/2019 10:36 AM   TRIG 162 (H) 06/01/2019 10:36 AM   HDL 55 06/01/2019 10:36 AM   CHOLHDL 3.4 06/01/2019 10:36 AM   LDLCALC 103 (H) 06/01/2019 10:36 AM    Wt Readings from Last 3 Encounters:  09/26/19 275 lb 6.4 oz (124.9 kg)  06/20/19 291 lb 9.6 oz (132.3 kg)  05/25/19 (!) 315 lb (142.9 kg)    Dated 11/07/19: cholesterol 145, triglycerides 95, HDL 29, LDL 97. Labs dated 11/27/19: WBC 2.4. plts 48K, Hgb 12.1. creatinine 0.84. mildly elevated AST and bili. Electrolytes normal.    Objective:  Vital Signs:  There were no vitals taken for this visit.   VITAL SIGNS:  reviewed  ASSESSMENT & PLAN:     1. NICM with acute on chronic combined systolic/diastolic CHF. NYHA class 3  - most recent EF in February showed decline in EF  to 35-40% with Covid infection. She has increased weight gain, edema and persistent symptoms of SOB/tightness. Recommend increasing lasix to 80 mg bid for 3 days then 80 mg in the am and 40 mg in the pm. Will add aldactone 12.5 mg daily. Switch losartan to Entresto 49/51 mg daily. Arrange for BMET early next week. Follow up in office in 2 weeks to  assess response to therapy and further titrate medication. Plan repeat Echo in 3 months after optimization of medication.  2.  Palpitation: HR increased. She is on Coreg. may need to consider increasing dose but will await response to above meds. Check Ecg on return visit.   3.  Hypertension: Blood pressure well controlled  4.  DM2: Currently not on any medication, defer management to primary care provider. Patient is going to call for follow up  5.  Liver NASH cirrhosis: Followed by Dr. Manuella Ghazi at [facility]  6.  History of CVA: No recent recurrence  7. Covid 19 infection with bilateral PNA in February 2021.    Time:   Today, I have spent 20 minutes with the patient with telehealth technology discussing the above problems.     Medication Adjustments/Labs and Tests Ordered: Current medicines are reviewed at length with the patient today.  Concerns regarding medicines are outlined above.   Tests Ordered: No orders of the defined types were placed in this encounter.   Medication Changes: No orders of the defined types were placed in this encounter.   Follow Up:  In Person in 2 week(s)with app  Signed, Xane Amsden Martinique, MD  11/29/2019 2:29 PM    [place]

## 2019-11-30 ENCOUNTER — Encounter: Payer: Self-pay | Admitting: Cardiology

## 2019-11-30 ENCOUNTER — Telehealth (INDEPENDENT_AMBULATORY_CARE_PROVIDER_SITE_OTHER): Payer: Medicare PPO | Admitting: Cardiology

## 2019-11-30 VITALS — BP 135/66 | HR 104 | Ht 63.0 in | Wt 278.0 lb

## 2019-11-30 DIAGNOSIS — E7800 Pure hypercholesterolemia, unspecified: Secondary | ICD-10-CM

## 2019-11-30 DIAGNOSIS — I428 Other cardiomyopathies: Secondary | ICD-10-CM | POA: Diagnosis not present

## 2019-11-30 DIAGNOSIS — E119 Type 2 diabetes mellitus without complications: Secondary | ICD-10-CM

## 2019-11-30 DIAGNOSIS — I5043 Acute on chronic combined systolic (congestive) and diastolic (congestive) heart failure: Secondary | ICD-10-CM

## 2019-11-30 DIAGNOSIS — I10 Essential (primary) hypertension: Secondary | ICD-10-CM

## 2019-11-30 HISTORY — DX: Acute on chronic combined systolic (congestive) and diastolic (congestive) heart failure: I50.43

## 2019-11-30 MED ORDER — SPIRONOLACTONE 25 MG PO TABS: 12.5000 mg | ORAL_TABLET | Freq: Every day | ORAL | 11 refills | Status: DC

## 2019-11-30 MED ORDER — ENTRESTO 49-51 MG PO TABS: 1.0000 | ORAL_TABLET | Freq: Two times a day (BID) | ORAL | 11 refills | Status: DC

## 2019-11-30 NOTE — Addendum Note (Signed)
Addended by: Kathyrn Lass on: 11/30/2019 09:54 AM   Modules accepted: Orders

## 2019-11-30 NOTE — Patient Instructions (Addendum)
Stop taking losartan  Start Entresto 49/51 mg twice a day  Start aldactone 12.5 mg daily  Increase lasix to 80 mg twice day for 3 days then resume 80 mg in the morning and 40 mg in the evening  We will check blood work next week and arrange in office follow up in 2 weeks.      Lab Bmet Thurs 3/18 You may eat Lab opens 8:00 am to 12:00 noon and 2:00 pm to 4:00 pm  Appointment scheduled with Almyra Deforest PA Wed 3/31 at 2:15 pm.

## 2019-12-07 LAB — BASIC METABOLIC PANEL
BUN/Creatinine Ratio: 16 (ref 12–28)
BUN: 15 mg/dL (ref 8–27)
CO2: 27 mmol/L (ref 20–29)
Calcium: 9.3 mg/dL (ref 8.7–10.3)
Chloride: 102 mmol/L (ref 96–106)
Creatinine, Ser: 0.91 mg/dL (ref 0.57–1.00)
GFR calc Af Amer: 79 mL/min/{1.73_m2} (ref >59)
GFR calc non Af Amer: 68 mL/min/{1.73_m2} (ref >59)
Glucose: 197 mg/dL — ABNORMAL HIGH (ref 65–99)
Potassium: 3.3 mmol/L — ABNORMAL LOW (ref 3.5–5.2)
Sodium: 142 mmol/L (ref 134–144)

## 2019-12-20 ENCOUNTER — Ambulatory Visit: Payer: Medicare PPO | Admitting: Physician Assistant

## 2019-12-20 ENCOUNTER — Other Ambulatory Visit: Payer: Self-pay

## 2019-12-20 VITALS — BP 114/72 | HR 73 | Ht 63.0 in | Wt 267.0 lb

## 2019-12-20 DIAGNOSIS — K7460 Unspecified cirrhosis of liver: Secondary | ICD-10-CM

## 2019-12-20 DIAGNOSIS — K7581 Nonalcoholic steatohepatitis (NASH): Secondary | ICD-10-CM

## 2019-12-20 DIAGNOSIS — E119 Type 2 diabetes mellitus without complications: Secondary | ICD-10-CM

## 2019-12-20 DIAGNOSIS — R0789 Other chest pain: Secondary | ICD-10-CM

## 2019-12-20 DIAGNOSIS — I10 Essential (primary) hypertension: Secondary | ICD-10-CM

## 2019-12-20 DIAGNOSIS — I5022 Chronic systolic (congestive) heart failure: Secondary | ICD-10-CM | POA: Diagnosis not present

## 2019-12-20 DIAGNOSIS — E876 Hypokalemia: Secondary | ICD-10-CM | POA: Diagnosis not present

## 2019-12-20 NOTE — Patient Instructions (Signed)
Medication Instructions:  Your physician recommends that you continue on your current medications as directed. Please refer to the Current Medication list given to you today.  *If you need a refill on your cardiac medications before your next appointment, please call your pharmacy*   Lab Work: Your physician recommends that you return for lab work TODAY:  BMET  If you have labs (blood work) drawn today and your tests are completely normal, you will receive your results only by: [name] MyChart Message (if you have MyChart) OR . A paper copy in the mail If you have any lab test that is abnormal or we need to change your treatment, we will call you to review the results.  Testing/Procedures: Your physician has requested that you have an echocardiogram. Echocardiography is a painless test that uses sound waves to create images of your heart. It provides your doctor with information about the size and shape of your heart and how well your heart's chambers and valves are working. This procedure takes approximately one hour. There are no restrictions for this procedure.   PLEASE SCHEDULE FOR MIDDLE OF JUNE  Follow-Up: At [facility], you and your health needs are our priority.  As part of our continuing mission to provide you with exceptional heart care, we have created designated Provider Care Teams.  These Care Teams include your primary Cardiologist (physician) and Advanced Practice Providers (APPs -  Physician Assistants and Nurse Practitioners) who all work together to provide you with the care you need, when you need it.  Your next appointment:   3 month(s)  The format for your next appointment:   In Person  Provider:   [name], MD  Other Instructions

## 2019-12-20 NOTE — Progress Notes (Signed)
Cardiology Office Note:    Date:  12/22/2019   ID:  Rebecca, Jimenez April 09, 1956, MRN [id number]  PCP:  Rebecca Broker, MD  Cardiologist:  Rebecca Martinique, MD  Electrophysiologist:  None   Referring MD: Rebecca Broker, MD   Chief Complaint  Patient presents with  . Chest Pain    pain, heart burn feeling    History of Present Illness:    Rebecca Jimenez is a 64 y.o. female with a hx of Sjogren's syndrome, HTN, DM 2,NASHcirrhosis followed [facility](confirmed on biopsy 2014), PVCs,NICM, OSA and history of CVA. She had a remote cardiac catheterization in 2009 which showed no significant CAD. Myoview in September 2017 showed EF 46%, no ischemia. Echocardiogram showed global hypokinesis with borderline LV dysfunction. She also has a chronic left bundle branch block. 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 (She would require clearance from liver specialist and thrombopoietin receptoragonist such as Avatrombopagshould she require any surgery in the future due to risk of bleeding) Patient was recently seen by her liver specialist at [place].  I saw the patient in September 2020 for volume overload and weight gain.  Lasix was increased to 80 mg a.m. and 40 mg p.m.  She had significant weight loss and was breathing better afterward.  She was admitted to [facility] in early February with COVID-19 pneumonia.  Echocardiogram showed EF of 35 to 40%.  CT of chest and the chest x-ray showed diffuse pulmonary infiltrate.  She was seen by Dr. Martinique virtually on 11/30/2019, at which time she has gained 10 pounds since hospital discharge.  I recommended she increase Lasix to 80 mg twice daily for 3 days before going back to 80 mg a.m. and 40 mg p.m.  Losartan was switched to Entresto 49-51 mg twice daily.  Aldactone was also added to her medical regimen.  Current plan is for up titration of heart failure  medication and repeat echocardiogram in 3 months.  Patient presents today for cardiology office visit.  Her blood pressure is 114/72, I do not have any room to further uptitrate heart failure medication.  Previous basic metabolic panel showed a low potassium level.  I will repeat basic metabolic panel today.  She will need repeat echocardiogram in mid June and follow-up with Dr. Martinique in late June.  She has been having some burning sensation in the chest ever since her COVID-19 infection.  This burning sensation worsens with body rotation and deep inspiration.  The degree and the frequency of the chest discomfort has not improved or subsided since her hospitalization.  It mostly occur when she is doing strenuous activity.  She is aware that if her chest pain worsens, she will need to go to the hospital.  If her echocardiogram continue to show low ejection fraction, I would consider a ischemic work-up.  Past Medical History:  Diagnosis Date  . Acute on chronic combined systolic and diastolic CHF (congestive heart failure) ([place]) 11/30/2019  . Diabetes mellitus without complication ([place])   . Dry skin    Dry skin from sjorgrens  . Hypertension   . Irregular heart beat   . NASH (nonalcoholic steatohepatitis)   . Sjogren's syndrome ([place])   . Sleep apnea   . Stroke [facility])     Past Surgical History:  Procedure Laterality Date  . ABDOMINAL HYSTERECTOMY  1989  . BACK SURGERY  2000, 2007  . CHOLECYSTECTOMY    .  COSMETIC SURGERY  1978   Face  . ESOPHAGOGASTRODUODENOSCOPY  11/11/2017   Grade 1 esophageal varices (too small for EVL). Moderate portal hypertensive gastropathy. Otherwise, normal EGD  . HAND SURGERY  1978, 1983  . INGUINAL HERNIA REPAIR Left   . KNEE SURGERY  2013  . ROTATOR CUFF REPAIR Left   . TONSILLECTOMY      Current Medications: Current Meds  Medication Sig  . carvedilol (COREG) 12.5 MG tablet Take 1 tablet (12.5 mg total) by mouth 2 (two) times daily with a meal.  .  Dulaglutide (TRULICITY) 2.37 SE/8.3TD SOPN Inject into the skin once a week.  . furosemide (LASIX) 40 MG tablet Take 2 tablets (80 mg total) by mouth every morning AND 1 tablet (40 mg total) every evening.  [name] glipiZIDE (GLUCOTROL) 5 MG tablet Take by mouth daily before breakfast.  . potassium chloride SA (K-DUR) 20 MEQ tablet Take 1 tablet (20 mEq total) by mouth 2 (two) times daily.  . sacubitril-valsartan (ENTRESTO) 49-51 MG Take 1 tablet by mouth 2 (two) times daily.  [name] spironolactone (ALDACTONE) 25 MG tablet Take 0.5 tablets (12.5 mg total) by mouth daily.  . Vitamin D, Ergocalciferol, (DRISDOL) 1.25 MG (50000 UNIT) CAPS capsule Take 50,000 Units by mouth every 7 (seven) days.     Allergies:   Ceftriaxone, Adenosine, Codeine, Morphine and related, Oxycodone, Penicillins, Rocephin [ceftriaxone sodium in dextrose], and Sulfamethoxazole   Social History   Socioeconomic History  . Marital status: Married    Spouse name: Rebecca Jimenez  . Number of children: 2  . Years of education: S. College  . Highest education level: Not on file  Occupational History  . Occupation: Disabled  Tobacco Use  . Smoking status: Former Smoker    Packs/day: 0.25    Types: Cigarettes    Quit date: 05/11/2015    Years since quitting: 4.6  . Smokeless tobacco: Never Used  Substance and Sexual Activity  . Alcohol use: Yes    Comment: Consumes alcohol occasionally  . Drug use: No  . Sexual activity: Not on file  Other Topics Concern  . Not on file  Social History Narrative   Patient lives at home with her Husband Rebecca Jimenez). Patient is disabled. Patient has two children. Caffeine three cups. Left handed.    Social Determinants of Health   Financial Resource Strain:   . Difficulty of Paying Living Expenses:   Food Insecurity:   . Worried About Charity fundraiser in the Last Year:   . Arboriculturist in the Last Year:   Transportation Needs:   . Film/video editor (Medical):   [name] Lack of Transportation  (Non-Medical):   Physical Activity:   . Days of Exercise per Week:   . Minutes of Exercise per Session:   Stress:   . Feeling of Stress :   Social Connections:   . Frequency of Communication with Friends and Family:   . Frequency of Social Gatherings with Friends and Family:   . Attends Religious Services:   . Active Member of Clubs or Organizations:   . Attends Archivist Meetings:   [name] Marital Status:      Family History: The patient's family history includes Cancer - Prostate in her father; Cerebrovascular Accident in her father; Diabetes in her brother, father, and mother; Heart disease in her father.  ROS:   Please see the history of present illness.     All other systems reviewed and are negative.  EKGs/Labs/Other Studies  Reviewed:    The following studies were reviewed today:  N/A  EKG:  EKG is ordered today.  The ekg ordered today demonstrates normal sinus rhythm with left bundle branch block.  Recent Labs: 12/20/2019: BUN 19; Creatinine, Ser 1.06; Potassium 4.1; Sodium 143  Recent Lipid Panel    Component Value Date/Time   CHOL 186 06/01/2019 1036   TRIG 162 (H) 06/01/2019 1036   HDL 55 06/01/2019 1036   CHOLHDL 3.4 06/01/2019 1036   LDLCALC 103 (H) 06/01/2019 1036    Physical Exam:    VS:  BP 114/72 (BP Location: Left Wrist, Patient Position: Sitting, Cuff Size: Normal)   Pulse 73   Ht 5' 3"  (1.6 m)   Wt 267 lb (121.1 kg)   BMI 47.30 kg/m     Wt Readings from Last 3 Encounters:  12/20/19 267 lb (121.1 kg)  11/30/19 278 lb (126.1 kg)  09/26/19 275 lb 6.4 oz (124.9 kg)     GEN:  Well nourished, well developed in no acute distress HEENT: Normal NECK: No JVD; No carotid bruits LYMPHATICS: No lymphadenopathy CARDIAC: RRR, no murmurs, rubs, gallops RESPIRATORY:  Clear to auscultation without rales, wheezing or rhonchi  ABDOMEN: Soft, non-tender, non-distended MUSCULOSKELETAL:  No edema; No deformity  SKIN: Warm and dry NEUROLOGIC:  Alert  and oriented x 3 PSYCHIATRIC:  Normal affect   ASSESSMENT:    1. Chronic systolic heart failure ([place])   2. Hypokalemia   3. Essential hypertension   4. Controlled type 2 diabetes mellitus without complication, without long-term current use of insulin ([place])   5. Liver cirrhosis secondary to NASH ([place])   6. Atypical chest pain    PLAN:    In order of problems listed above:  1. Chronic systolic heart failure: Continue carvedilol, Entresto and spironolactone.  No further blood pressure room to uptitrate heart failure medication.  Repeat echocardiogram in 2 to 3 months  2. Hypokalemia: Obtain basic metabolic panel  3. Hypertension: Blood pressure stable on current therapy  4. DM2: Managed by primary care provider  5. NASH cirrhosis: Followed by [facility] physician  6. Atypical chest pain: Has been intermittent since the COVID-19 infection.  Likely related to underlying inflammation.  Recommended the patient to seek urgent medical attention if chest pain suddenly become worse.   Medication Adjustments/Labs and Tests Ordered: Current medicines are reviewed at length with the patient today.  Concerns regarding medicines are outlined above.  Orders Placed This Encounter  Procedures  . Basic metabolic panel  . EKG 12-Lead  . ECHOCARDIOGRAM COMPLETE   No orders of the defined types were placed in this encounter.   Patient Instructions  Medication Instructions:  Your physician recommends that you continue on your current medications as directed. Please refer to the Current Medication list given to you today.  *If you need a refill on your cardiac medications before your next appointment, please call your pharmacy*   Lab Work: Your physician recommends that you return for lab work TODAY:  BMET  If you have labs (blood work) drawn today and your tests are completely normal, you will receive your results only by: [name] MyChart Message (if you have MyChart) OR . A paper copy in the  mail If you have any lab test that is abnormal or we need to change your treatment, we will call you to review the results.  Testing/Procedures: Your physician has requested that you have an echocardiogram. Echocardiography is a painless test that uses sound waves to create images  of your heart. It provides your doctor with information about the size and shape of your heart and how well your heart's chambers and valves are working. This procedure takes approximately one hour. There are no restrictions for this procedure.   PLEASE SCHEDULE FOR MIDDLE OF JUNE  Follow-Up: At [facility], you and your health needs are our priority.  As part of our continuing mission to provide you with exceptional heart care, we have created designated Provider Care Teams.  These Care Teams include your primary Cardiologist (physician) and Advanced Practice Providers (APPs -  Physician Assistants and Nurse Practitioners) who all work together to provide you with the care you need, when you need it.  Your next appointment:   3 month(s)  The format for your next appointment:   In Person  Provider:   [name], MD  Other Instructions      Signed, Almyra Deforest, [place]  12/22/2019 11:28 PM    [place]

## 2019-12-21 LAB — BASIC METABOLIC PANEL
BUN/Creatinine Ratio: 18 (ref 12–28)
BUN: 19 mg/dL (ref 8–27)
CO2: 26 mmol/L (ref 20–29)
Calcium: 9.7 mg/dL (ref 8.7–10.3)
Chloride: 103 mmol/L (ref 96–106)
Creatinine, Ser: 1.06 mg/dL — ABNORMAL HIGH (ref 0.57–1.00)
GFR calc Af Amer: 65 mL/min/{1.73_m2} (ref >59)
GFR calc non Af Amer: 57 mL/min/{1.73_m2} — ABNORMAL LOW (ref >59)
Glucose: 155 mg/dL — ABNORMAL HIGH (ref 65–99)
Potassium: 4.1 mmol/L (ref 3.5–5.2)
Sodium: 143 mmol/L (ref 134–144)

## 2019-12-21 NOTE — Progress Notes (Signed)
Stable renal function and electrolyte

## 2019-12-22 ENCOUNTER — Encounter: Payer: Self-pay | Admitting: Physician Assistant

## 2020-01-23 ENCOUNTER — Emergency Department (HOSPITAL_COMMUNITY): Payer: Medicare PPO

## 2020-01-23 ENCOUNTER — Observation Stay (HOSPITAL_COMMUNITY)
Admission: EM | Admit: 2020-01-23 | Discharge: 2020-01-25 | Disposition: A | Discharge status: Home / Self Care | Payer: Medicare PPO | Attending: Internal Medicine | Admitting: Internal Medicine

## 2020-01-23 ENCOUNTER — Telehealth: Payer: Self-pay | Admitting: Cardiology

## 2020-01-23 ENCOUNTER — Encounter (HOSPITAL_COMMUNITY): Payer: Self-pay | Admitting: Emergency Medicine

## 2020-01-23 ENCOUNTER — Other Ambulatory Visit: Payer: Self-pay

## 2020-01-23 DIAGNOSIS — Z7984 Long term (current) use of oral hypoglycemic drugs: Secondary | ICD-10-CM | POA: Diagnosis not present

## 2020-01-23 DIAGNOSIS — Z8673 Personal history of transient ischemic attack (TIA), and cerebral infarction without residual deficits: Secondary | ICD-10-CM | POA: Diagnosis not present

## 2020-01-23 DIAGNOSIS — I5042 Chronic combined systolic (congestive) and diastolic (congestive) heart failure: Secondary | ICD-10-CM | POA: Diagnosis not present

## 2020-01-23 DIAGNOSIS — I447 Left bundle-branch block, unspecified: Secondary | ICD-10-CM | POA: Insufficient documentation

## 2020-01-23 DIAGNOSIS — R079 Chest pain, unspecified: Secondary | ICD-10-CM | POA: Diagnosis present

## 2020-01-23 DIAGNOSIS — I110 Hypertensive heart disease with heart failure: Secondary | ICD-10-CM | POA: Diagnosis not present

## 2020-01-23 DIAGNOSIS — Z20822 Contact with and (suspected) exposure to covid-19: Secondary | ICD-10-CM | POA: Insufficient documentation

## 2020-01-23 DIAGNOSIS — E119 Type 2 diabetes mellitus without complications: Secondary | ICD-10-CM | POA: Insufficient documentation

## 2020-01-23 DIAGNOSIS — K7460 Unspecified cirrhosis of liver: Secondary | ICD-10-CM | POA: Insufficient documentation

## 2020-01-23 DIAGNOSIS — G4733 Obstructive sleep apnea (adult) (pediatric): Secondary | ICD-10-CM | POA: Insufficient documentation

## 2020-01-23 DIAGNOSIS — K7581 Nonalcoholic steatohepatitis (NASH): Secondary | ICD-10-CM | POA: Diagnosis not present

## 2020-01-23 DIAGNOSIS — E785 Hyperlipidemia, unspecified: Secondary | ICD-10-CM | POA: Insufficient documentation

## 2020-01-23 DIAGNOSIS — Z87891 Personal history of nicotine dependence: Secondary | ICD-10-CM | POA: Diagnosis not present

## 2020-01-23 DIAGNOSIS — I10 Essential (primary) hypertension: Secondary | ICD-10-CM | POA: Diagnosis present

## 2020-01-23 DIAGNOSIS — M3500 Sicca syndrome, unspecified: Secondary | ICD-10-CM | POA: Diagnosis not present

## 2020-01-23 DIAGNOSIS — Z8616 Personal history of COVID-19: Secondary | ICD-10-CM | POA: Insufficient documentation

## 2020-01-23 DIAGNOSIS — R0789 Other chest pain: Secondary | ICD-10-CM

## 2020-01-23 DIAGNOSIS — E669 Obesity, unspecified: Secondary | ICD-10-CM

## 2020-01-23 DIAGNOSIS — D61818 Other pancytopenia: Secondary | ICD-10-CM | POA: Diagnosis not present

## 2020-01-23 DIAGNOSIS — Z79899 Other long term (current) drug therapy: Secondary | ICD-10-CM | POA: Insufficient documentation

## 2020-01-23 DIAGNOSIS — E1169 Type 2 diabetes mellitus with other specified complication: Secondary | ICD-10-CM

## 2020-01-23 LAB — BASIC METABOLIC PANEL
Anion gap: 8 (ref 5–15)
BUN: 19 mg/dL (ref 8–23)
CO2: 29 mmol/L (ref 22–32)
Calcium: 9.3 mg/dL (ref 8.9–10.3)
Chloride: 106 mmol/L (ref 98–111)
Creatinine, Ser: 1.19 mg/dL — ABNORMAL HIGH (ref 0.44–1.00)
GFR calc Af Amer: 57 mL/min — ABNORMAL LOW (ref >60)
GFR calc non Af Amer: 49 mL/min — ABNORMAL LOW (ref >60)
Glucose, Bld: 169 mg/dL — ABNORMAL HIGH (ref 70–99)
Potassium: 3.7 mmol/L (ref 3.5–5.1)
Sodium: 143 mmol/L (ref 135–145)

## 2020-01-23 LAB — CBC
HCT: 36.7 % (ref 36.0–46.0)
Hemoglobin: 11.2 g/dL — ABNORMAL LOW (ref 12.0–15.0)
MCH: 29.8 pg (ref 26.0–34.0)
MCHC: 30.5 g/dL (ref 30.0–36.0)
MCV: 97.6 fL (ref 80.0–100.0)
Platelets: 47 10*3/uL — ABNORMAL LOW (ref 150–400)
RBC: 3.76 MIL/uL — ABNORMAL LOW (ref 3.87–5.11)
RDW: 14.3 % (ref 11.5–15.5)
WBC: 2.1 10*3/uL — ABNORMAL LOW (ref 4.0–10.5)
nRBC: 0 % (ref 0.0–0.2)

## 2020-01-23 LAB — TROPONIN I (HIGH SENSITIVITY)
Troponin I (High Sensitivity): 8 ng/L (ref <18)
Troponin I (High Sensitivity): 7 ng/L (ref <18)

## 2020-01-23 MED ORDER — SODIUM CHLORIDE 0.9% FLUSH: 3.0000 mL | Freq: Once | INTRAVENOUS | Status: DC

## 2020-01-23 NOTE — Telephone Encounter (Signed)
Will route to MD to make aware.

## 2020-01-23 NOTE — ED Triage Notes (Signed)
Pt reports intermittent central cp that radiates to her back with sob that started this morning. Pt reports it's worse with inspiration. Pt was seen at primary doctor and was told her ekg looked different and was sent here.

## 2020-01-23 NOTE — ED Provider Notes (Signed)
[facility] EMERGENCY DEPARTMENT Provider Note   CSN: [id number] Arrival date & time: 01/23/20  1526     History Chief Complaint  Patient presents with  . Chest Pain    Rebecca Jimenez is a 64 y.o. female.  Patient with history of CHF, HTN, diabetes, cirrhosis, sjorgen's syndrome presented to her PCP today for evaluation of left shoulder pain. While in the office, she developed central chest pain that radiated to the left shoulder. ECG obtained in the office was noted to have changes from prior. Patient sent to the ED for additional evaluation. Patient states she has been having intermittent chest pain over the last few days. She has become short of breath with exertion and fatigued.   The history is provided by the patient. No language interpreter was used.  Chest Pain Pain location:  Substernal area Pain quality: pressure and tightness   Pain radiates to:  L shoulder Pain severity:  Moderate Onset quality:  Gradual Timing:  Sporadic Progression:  Waxing and waning Chronicity:  Recurrent Associated symptoms: fatigue, lower extremity edema and shortness of breath   Associated symptoms: no abdominal pain, no back pain and no cough   Risk factors: diabetes mellitus and hypertension        Past Medical History:  Diagnosis Date  . Acute on chronic combined systolic and diastolic CHF (congestive heart failure) ([place]) 11/30/2019  . Diabetes mellitus without complication ([place])   . Dry skin    Dry skin from sjorgrens  . Hypertension   . Irregular heart beat   . NASH (nonalcoholic steatohepatitis)   . Sjogren's syndrome ([place])   . Sleep apnea   . Stroke [facility])     Patient Active Problem List   Diagnosis Date Noted  . Acute on chronic combined systolic and diastolic CHF (congestive heart failure) ([place]) 11/30/2019  . Diabetes 02/16/2013  . Morbid obesity ([place]) 02/16/2013  . Essential hypertension 02/16/2013  . Other and unspecified hyperlipidemia  02/16/2013  . Frequent headaches 02/16/2013    Past Surgical History:  Procedure Laterality Date  . ABDOMINAL HYSTERECTOMY  1989  . BACK SURGERY  2000, 2007  . CHOLECYSTECTOMY    . COSMETIC SURGERY  1978   Face  . ESOPHAGOGASTRODUODENOSCOPY  11/11/2017   Grade 1 esophageal varices (too small for EVL). Moderate portal hypertensive gastropathy. Otherwise, normal EGD  . HAND SURGERY  1978, 1983  . INGUINAL HERNIA REPAIR Left   . KNEE SURGERY  2013  . ROTATOR CUFF REPAIR Left   . TONSILLECTOMY       OB History   No obstetric history on file.     Family History  Problem Relation Age of Onset  . Diabetes Mother   . Cerebrovascular Accident Father   . Diabetes Father   . Cancer - Prostate Father   . Heart disease Father   . Diabetes Brother     Social History   Tobacco Use  . Smoking status: Former Smoker    Packs/day: 0.25    Types: Cigarettes    Quit date: 05/11/2015    Years since quitting: 4.7  . Smokeless tobacco: Never Used  Substance Use Topics  . Alcohol use: Yes    Comment: Consumes alcohol occasionally  . Drug use: No    Home Medications Prior to Admission medications   Medication Sig Start Date End Date Taking? Authorizing Provider  carvedilol (COREG) 12.5 MG tablet Take 1 tablet (12.5 mg total) by mouth 2 (two) times daily with a  meal. 06/20/19   Almyra Deforest, PA  Dulaglutide (TRULICITY) 9.92 EQ/6.8TM SOPN Inject into the skin once a week.    [provider]  furosemide (LASIX) 40 MG tablet Take 2 tablets (80 mg total) by mouth every morning AND 1 tablet (40 mg total) every evening. 05/25/19   Almyra Deforest, PA  glipiZIDE (GLUCOTROL) 5 MG tablet Take by mouth daily before breakfast.    [provider]  potassium chloride SA (K-DUR) 20 MEQ tablet Take 1 tablet (20 mEq total) by mouth 2 (two) times daily. 05/25/19   Almyra Deforest, PA  sacubitril-valsartan (ENTRESTO) 49-51 MG Take 1 tablet by mouth 2 (two) times daily. 11/30/19   Martinique, Peter M, MD   spironolactone (ALDACTONE) 25 MG tablet Take 0.5 tablets (12.5 mg total) by mouth daily. 11/30/19 11/24/20  Martinique, Peter M, MD  Vitamin D, Ergocalciferol, (DRISDOL) 1.25 MG (50000 UNIT) CAPS capsule Take 50,000 Units by mouth every 7 (seven) days.    [provider]    Allergies    Ceftriaxone, Adenosine, Codeine, Morphine and related, Oxycodone, Penicillins, Rocephin [ceftriaxone sodium in dextrose], and Sulfamethoxazole  Review of Systems   Review of Systems  Constitutional: Positive for fatigue.  Respiratory: Positive for shortness of breath. Negative for cough.   Cardiovascular: Positive for chest pain.  Gastrointestinal: Negative for abdominal pain.  Musculoskeletal: Positive for arthralgias. Negative for back pain.  All other systems reviewed and are negative.   Physical Exam Updated Vital Signs BP (!) 126/55   Pulse 62   Temp 98.2 F (36.8 C) (Oral)   Resp 16   Ht 5' 3"  (1.6 m)   Wt 123.4 kg   SpO2 98%   BMI 48.18 kg/m   Physical Exam Vitals and nursing note reviewed.  Constitutional:      General: She is not in acute distress.    Appearance: She is well-developed.  HENT:     Head: Normocephalic.     Mouth/Throat:     Mouth: Mucous membranes are moist.  Eyes:     Conjunctiva/sclera: Conjunctivae normal.  Cardiovascular:     Rate and Rhythm: Normal rate and regular rhythm.  Pulmonary:     Breath sounds: Normal breath sounds.  Abdominal:     General: There is no distension.     Palpations: Abdomen is soft.     Tenderness: There is no abdominal tenderness.  Musculoskeletal:     Right lower leg: Edema present.     Left lower leg: Edema present.  Skin:    General: Skin is warm and dry.  Neurological:     Mental Status: She is alert and oriented to person, place, and time.  Psychiatric:        Mood and Affect: Mood normal.        Behavior: Behavior normal.     ED Results / Procedures / Treatments   Labs (all labs ordered are listed, but only  abnormal results are displayed) Labs Reviewed  BASIC METABOLIC PANEL - Abnormal; Notable for the following components:      Result Value   Glucose, Bld 169 (*)    Creatinine, Ser 1.19 (*)    GFR calc non Af Amer 49 (*)    GFR calc Af Amer 57 (*)    All other components within normal limits  CBC - Abnormal; Notable for the following components:   WBC 2.1 (*)    RBC 3.76 (*)    Hemoglobin 11.2 (*)    Platelets 47 (*)  All other components within normal limits  TROPONIN I (HIGH SENSITIVITY)  TROPONIN I (HIGH SENSITIVITY)    EKG EKG Interpretation  Date/Time:  Tuesday Jan 23 2020 15:33:12 EDT Ventricular Rate:  77 PR Interval:  180 QRS Duration: 164 QT Interval:  446 QTC Calculation: 504 R Axis:   -50 Text Interpretation: Sinus rhythm with occasional Premature ventricular complexes Left axis deviation Left bundle branch block Abnormal ECG No prior ECG for comparison. No STEMI Confirmed by Antony Blackbird [phone]) on 01/23/2020 6:06:22 PM   Radiology DG Chest 2 View  Result Date: 01/23/2020 CLINICAL DATA:  Chest pain EXAM: CHEST - 2 VIEW COMPARISON:  11/04/2019. FINDINGS: Mediastinum and hilar structures normal. Cardiomegaly. Borderline pulmonary venous congestion. No focal infiltrate. Mild left base subsegmental atelectasis. Calcified nodular densities again noted the right upper lung consistent granulomas. No pleural effusion or pneumothorax. Degenerative change thoracic spine. IMPRESSION: Cardiomegaly. Borderline pulmonary venous congestion infiltrate. Mild left base subsegmental atelectasis. Electronically Signed   By: [name]  Register   On: 01/23/2020 16:09    Procedures Procedures (including critical care time)  Medications Ordered in ED Medications  sodium chloride flush (NS) 0.9 % injection 3 mL (3 mLs Intravenous Not Given 01/23/20 1647)    ED Course  I have reviewed the triage vital signs and the nursing notes.  Pertinent labs & imaging results that were available  during my care of the patient were reviewed by me and considered in my medical decision making (see chart for details).    MDM Rules/Calculators/A&P                      Patient presents to ED for evaluation of chest pain. She is currently chest pain free. Venous congestion on chest xray. Increased edema of lower extremites. Troponin negative. No STEMI changes noted on ECG. Hx of non-ischemic cardiomyopathy, followed by Dr. Martinique with cardiology. Will request admission for high risk chest pain.  Spoke with hospitalist, will admit.  Final Clinical Impression(s) / ED Diagnoses Final diagnoses:  None    Rx / DC Orders ED Discharge Orders    None       Etta Quill, NP 01/23/20 2358    Tegeler, Gwenyth Allegra, MD 01/24/20 [phone]

## 2020-01-23 NOTE — Progress Notes (Signed)
Patient arrived to 4E27 from [facility] ED. Pt alert and oriented x 4. Vital signs stable, no pain or complaints. Pt oriented to room and call bell. [facility] hospitalist admissions paged for admit orders.

## 2020-01-23 NOTE — Telephone Encounter (Signed)
New Message  Patient is calling in to inform Dr. Martinique that patient was seen by her PCP at [facility] and had an abnormal EKG and is having chest pains. Patient was instructed by PCP to go to [facility] ER. Patient is on the way to the ER. Please give patient a call if necessary.

## 2020-01-24 ENCOUNTER — Encounter (HOSPITAL_COMMUNITY): Payer: Self-pay | Admitting: Internal Medicine

## 2020-01-24 ENCOUNTER — Observation Stay (HOSPITAL_COMMUNITY): Payer: Medicare PPO

## 2020-01-24 DIAGNOSIS — E669 Obesity, unspecified: Secondary | ICD-10-CM | POA: Diagnosis present

## 2020-01-24 DIAGNOSIS — R072 Precordial pain: Secondary | ICD-10-CM

## 2020-01-24 DIAGNOSIS — R079 Chest pain, unspecified: Principal | ICD-10-CM

## 2020-01-24 DIAGNOSIS — E119 Type 2 diabetes mellitus without complications: Secondary | ICD-10-CM | POA: Diagnosis present

## 2020-01-24 DIAGNOSIS — E1169 Type 2 diabetes mellitus with other specified complication: Secondary | ICD-10-CM | POA: Diagnosis not present

## 2020-01-24 DIAGNOSIS — I5042 Chronic combined systolic (congestive) and diastolic (congestive) heart failure: Secondary | ICD-10-CM

## 2020-01-24 DIAGNOSIS — I10 Essential (primary) hypertension: Secondary | ICD-10-CM | POA: Diagnosis not present

## 2020-01-24 LAB — TROPONIN I (HIGH SENSITIVITY)
Troponin I (High Sensitivity): 8 ng/L (ref <18)
Troponin I (High Sensitivity): 8 ng/L (ref <18)

## 2020-01-24 LAB — SARS CORONAVIRUS 2 (TAT 6-24 HRS): SARS Coronavirus 2: NEGATIVE

## 2020-01-24 LAB — CBC WITH DIFFERENTIAL/PLATELET
Abs Immature Granulocytes: 0 10*3/uL (ref 0.00–0.07)
Basophils Absolute: 0 10*3/uL (ref 0.0–0.1)
Basophils Relative: 1 %
Eosinophils Absolute: 0.1 10*3/uL (ref 0.0–0.5)
Eosinophils Relative: 3 %
HCT: 33 % — ABNORMAL LOW (ref 36.0–46.0)
Hemoglobin: 10.4 g/dL — ABNORMAL LOW (ref 12.0–15.0)
Immature Granulocytes: 0 %
Lymphocytes Relative: 34 %
Lymphs Abs: 0.7 10*3/uL (ref 0.7–4.0)
MCH: 30.5 pg (ref 26.0–34.0)
MCHC: 31.5 g/dL (ref 30.0–36.0)
MCV: 96.8 fL (ref 80.0–100.0)
Monocytes Absolute: 0.2 10*3/uL (ref 0.1–1.0)
Monocytes Relative: 10 %
Neutro Abs: 1.1 10*3/uL — ABNORMAL LOW (ref 1.7–7.7)
Neutrophils Relative %: 52 %
Platelets: 46 10*3/uL — ABNORMAL LOW (ref 150–400)
RBC: 3.41 MIL/uL — ABNORMAL LOW (ref 3.87–5.11)
RDW: 14.1 % (ref 11.5–15.5)
WBC: 2.1 10*3/uL — ABNORMAL LOW (ref 4.0–10.5)
nRBC: 0 % (ref 0.0–0.2)

## 2020-01-24 LAB — HEPATIC FUNCTION PANEL
ALT: 21 U/L (ref 0–44)
AST: 31 U/L (ref 15–41)
Albumin: 2.9 g/dL — ABNORMAL LOW (ref 3.5–5.0)
Alkaline Phosphatase: 122 U/L (ref 38–126)
Bilirubin, Direct: 0.3 mg/dL — ABNORMAL HIGH (ref 0.0–0.2)
Indirect Bilirubin: 0.9 mg/dL (ref 0.3–0.9)
Total Bilirubin: 1.2 mg/dL (ref 0.3–1.2)
Total Protein: 5.6 g/dL — ABNORMAL LOW (ref 6.5–8.1)

## 2020-01-24 LAB — D-DIMER, QUANTITATIVE: D-Dimer, Quant: 0.47 ug/mL-FEU (ref 0.00–0.50)

## 2020-01-24 LAB — GLUCOSE, CAPILLARY
Glucose-Capillary: 80 mg/dL (ref 70–99)
Glucose-Capillary: 122 mg/dL — ABNORMAL HIGH (ref 70–99)
Glucose-Capillary: 100 mg/dL — ABNORMAL HIGH (ref 70–99)
Glucose-Capillary: 86 mg/dL (ref 70–99)

## 2020-01-24 LAB — BASIC METABOLIC PANEL
Anion gap: 7 (ref 5–15)
BUN: 17 mg/dL (ref 8–23)
CO2: 28 mmol/L (ref 22–32)
Calcium: 9.3 mg/dL (ref 8.9–10.3)
Chloride: 110 mmol/L (ref 98–111)
Creatinine, Ser: 0.99 mg/dL (ref 0.44–1.00)
GFR calc Af Amer: >60 mL/min (ref >60)
GFR calc non Af Amer: >60 mL/min (ref >60)
Glucose, Bld: 83 mg/dL (ref 70–99)
Potassium: 3.5 mmol/L (ref 3.5–5.1)
Sodium: 145 mmol/L (ref 135–145)

## 2020-01-24 LAB — MAGNESIUM: Magnesium: 2.2 mg/dL (ref 1.7–2.4)

## 2020-01-24 LAB — TSH: TSH: 2.617 u[IU]/mL (ref 0.350–4.500)

## 2020-01-24 LAB — HIV ANTIBODY (ROUTINE TESTING W REFLEX): HIV Screen 4th Generation wRfx: NONREACTIVE

## 2020-01-24 MED ORDER — NITROGLYCERIN 0.4 MG SL SUBL
0.8000 mg | SUBLINGUAL_TABLET | Freq: Once | SUBLINGUAL | Status: AC
  Administered 2020-01-24: 16:00:00 0.8 mg via SUBLINGUAL

## 2020-01-24 MED ORDER — SACUBITRIL-VALSARTAN 49-51 MG PO TABS
1.0000 | ORAL_TABLET | Freq: Two times a day (BID) | ORAL | Status: DC
  Administered 2020-01-24 – 2020-01-25 (×4): 1 via ORAL
  Filled 2020-01-24 (×4): qty 1

## 2020-01-24 MED ORDER — CARVEDILOL 12.5 MG PO TABS
12.5000 mg | ORAL_TABLET | Freq: Two times a day (BID) | ORAL | Status: DC
  Administered 2020-01-24 – 2020-01-25 (×2): 12.5 mg via ORAL
  Filled 2020-01-24 (×2): qty 1

## 2020-01-24 MED ORDER — SPIRONOLACTONE 12.5 MG HALF TABLET
12.5000 mg | ORAL_TABLET | Freq: Every day | ORAL | Status: DC
  Administered 2020-01-24 – 2020-01-25 (×2): 12.5 mg via ORAL
  Filled 2020-01-24 (×2): qty 1

## 2020-01-24 MED ORDER — PANTOPRAZOLE SODIUM 40 MG PO TBEC
40.0000 mg | DELAYED_RELEASE_TABLET | Freq: Every day | ORAL | Status: DC
  Administered 2020-01-24 – 2020-01-25 (×2): 40 mg via ORAL
  Filled 2020-01-24 (×2): qty 1

## 2020-01-24 MED ORDER — ALUM & MAG HYDROXIDE-SIMETH 200-200-20 MG/5ML PO SUSP: 15.0000 mL | ORAL | Status: DC | PRN

## 2020-01-24 MED ORDER — FUROSEMIDE 80 MG PO TABS
80.0000 mg | ORAL_TABLET | Freq: Every day | ORAL | Status: DC
  Administered 2020-01-24 – 2020-01-25 (×2): 80 mg via ORAL
  Filled 2020-01-24 (×2): qty 1

## 2020-01-24 MED ORDER — INSULIN ASPART 100 UNIT/ML ~~LOC~~ SOLN
0.0000 [IU] | Freq: Three times a day (TID) | SUBCUTANEOUS | Status: DC
  Administered 2020-01-24: 12:00:00 1 [IU] via SUBCUTANEOUS

## 2020-01-24 MED ORDER — NITROGLYCERIN 0.4 MG SL SUBL
SUBLINGUAL_TABLET | SUBLINGUAL | Status: AC
  Filled 2020-01-24: qty 2

## 2020-01-24 MED ORDER — IOHEXOL 350 MG/ML SOLN
100.0000 mL | Freq: Once | INTRAVENOUS | Status: AC | PRN
  Administered 2020-01-24: 19:00:00 100 mL via INTRAVENOUS

## 2020-01-24 MED ORDER — METOPROLOL TARTRATE 50 MG PO TABS: 50.0000 mg | ORAL_TABLET | Freq: Once | ORAL | Status: DC

## 2020-01-24 MED ORDER — POTASSIUM CHLORIDE CRYS ER 20 MEQ PO TBCR
20.0000 meq | EXTENDED_RELEASE_TABLET | Freq: Two times a day (BID) | ORAL | Status: DC
  Administered 2020-01-24 – 2020-01-25 (×4): 20 meq via ORAL
  Filled 2020-01-24 (×4): qty 1

## 2020-01-24 NOTE — H&P (Signed)
History and Physical    Usha Slager WKM:[id number] DOB: 1955-12-24 DOA: 01/23/2020  PCP: Myrlene Broker, MD  Patient coming from: Home.  Chief Complaint: Chest pain.  HPI: Rebecca Jimenez is a 64 y.o. female with history of chronic systolic heart failure, cirrhosis of the liver, pancytopenia, diabetes mellitus, Sjogren's syndrome sleep apnea stroke was referred to the ER after patient had presented to the primary care physician with chest pain.  Patient states the chest pain started yesterday which is retrosternal pressure-like happening multiple times in the day lasting for around 5 min even at rest.  Patient stated the pain initially started on the left shoulder about 2 weeks ago which has been coming intermittently respected with exertion.  Couple of times patient also had mild shortness of breath and has noted increasing lower extremity edema.  ED Course: In the ER patient was chest pain-free without any intervention.  EKG shows LBBB which is chronic.  High sensitive troponin was negative lab work show pancytopenia with platelets of 47 which is chronic.  Creatinine 1.1 glucose 169 Covid test negative.  Chest x-ray shows possible congestion versus infiltrate but patient has no clinical symptoms of pneumonia.  Review of Systems: As per HPI, rest all negative.   Past Medical History:  Diagnosis Date  . Acute on chronic combined systolic and diastolic CHF (congestive heart failure) ([place]) 11/30/2019  . Diabetes mellitus without complication ([place])   . Dry skin    Dry skin from sjorgrens  . Hypertension   . Irregular heart beat   . NASH (nonalcoholic steatohepatitis)   . Sjogren's syndrome ([place])   . Sleep apnea   . Stroke [facility])     Past Surgical History:  Procedure Laterality Date  . ABDOMINAL HYSTERECTOMY  1989  . BACK SURGERY  2000, 2007  . CHOLECYSTECTOMY    . COSMETIC SURGERY  1978   Face  . ESOPHAGOGASTRODUODENOSCOPY  11/11/2017   Grade 1 esophageal varices  (too small for EVL). Moderate portal hypertensive gastropathy. Otherwise, normal EGD  . HAND SURGERY  1978, 1983  . INGUINAL HERNIA REPAIR Left   . KNEE SURGERY  2013  . ROTATOR CUFF REPAIR Left   . TONSILLECTOMY       reports that she quit smoking about 4 years ago. Her smoking use included cigarettes. She smoked 0.25 packs per day. She has never used smokeless tobacco. She reports current alcohol use. She reports that she does not use drugs.  Allergies  Allergen Reactions  . Ceftriaxone Shortness Of Breath    Throat swelling  . Adenosine     Rapid heart rate  . Codeine Rash  . Morphine And Related     Unknown reaction  . Oxycodone Nausea And Vomiting  . Penicillins     Unknown reaction  . Rocephin [Ceftriaxone Sodium In Dextrose]     Swollen throat  . Sulfamethoxazole Nausea And Vomiting    Family History  Problem Relation Age of Onset  . Diabetes Mother   . Cerebrovascular Accident Father   . Diabetes Father   . Cancer - Prostate Father   . Heart disease Father   . Diabetes Brother     Prior to Admission medications   Medication Sig Start Date End Date Taking? Authorizing Provider  carvedilol (COREG) 12.5 MG tablet Take 1 tablet (12.5 mg total) by mouth 2 (two) times daily with a meal. 06/20/19  Yes Meng, [place], PA  Dulaglutide (TRULICITY) 1.16 FB/9.0XY SOPN Inject into the skin once  a week.   Yes [provider]  furosemide (LASIX) 40 MG tablet Take 2 tablets (80 mg total) by mouth every morning AND 1 tablet (40 mg total) every evening. 05/25/19  Yes Meng, Isaac Laud, PA  glipiZIDE (GLUCOTROL) 5 MG tablet Take by mouth daily before breakfast.   Yes [provider]  potassium chloride SA (K-DUR) 20 MEQ tablet Take 1 tablet (20 mEq total) by mouth 2 (two) times daily. 05/25/19  Yes Meng, Isaac Laud, PA  sacubitril-valsartan (ENTRESTO) 49-51 MG Take 1 tablet by mouth 2 (two) times daily. 11/30/19  Yes [place], Peter M, MD  spironolactone (ALDACTONE) 25 MG tablet Take 0.5  tablets (12.5 mg total) by mouth daily. 11/30/19 11/24/20 Yes [place], Peter M, MD  Vitamin D, Ergocalciferol, (DRISDOL) 1.25 MG (50000 UNIT) CAPS capsule Take 50,000 Units by mouth every 7 (seven) days.   Yes [provider]    Physical Exam: Constitutional: Moderately built and nourished. Vitals:   01/23/20 2107 01/23/20 2131 01/23/20 2137 01/24/20 0027  BP: (!) 126/55  (!) 155/63 (!) 130/56  Pulse: 67  73 67  Resp: 18  19 20   Temp: 98.3 F (36.8 C)  97.6 F (36.4 C) 98 F (36.7 C)  TempSrc: Oral  Oral Oral  SpO2: 100%  99% 98%  Weight:  123.2 kg    Height:  5' 3"  (1.6 m)     Eyes: Anicteric no pallor. ENMT: No discharge from the ears eyes nose or mouth. Neck: No mass felt.  No neck rigidity. Respiratory: No rhonchi or crepitations. Cardiovascular: S1-S2 heard. Abdomen: Soft nontender bowel sounds present. Musculoskeletal: Mild edema of the both lower extremity. Skin: Chronic skin changes. Neurologic: Alert awake oriented time place and person.  Moves all extremities. Psychiatric: Appears normal.   Labs on Admission: I have personally reviewed following labs and imaging studies  CBC: Recent Labs  Lab 01/23/20 1542  WBC 2.1*  HGB 11.2*  HCT 36.7  MCV 97.6  PLT 47*   Basic Metabolic Panel: Recent Labs  Lab 01/23/20 1542  NA 143  K 3.7  CL 106  CO2 29  GLUCOSE 169*  BUN 19  CREATININE 1.19*  CALCIUM 9.3   GFR: Estimated Creatinine Clearance: 63.2 mL/min (A) (by C-G formula based on SCr of 1.19 mg/dL (H)). Liver Function Tests: No results for input(s): AST, ALT, ALKPHOS, BILITOT, PROT, ALBUMIN in the last 168 hours. No results for input(s): LIPASE, AMYLASE in the last 168 hours. No results for input(s): AMMONIA in the last 168 hours. Coagulation Profile: No results for input(s): INR, PROTIME in the last 168 hours. Cardiac Enzymes: No results for input(s): CKTOTAL, CKMB, CKMBINDEX, TROPONINI in the last 168 hours. BNP (last 3 results) No results  for input(s): PROBNP in the last 8760 hours. HbA1C: No results for input(s): HGBA1C in the last 72 hours. CBG: No results for input(s): GLUCAP in the last 168 hours. Lipid Profile: No results for input(s): CHOL, HDL, LDLCALC, TRIG, CHOLHDL, LDLDIRECT in the last 72 hours. Thyroid Function Tests: No results for input(s): TSH, T4TOTAL, FREET4, T3FREE, THYROIDAB in the last 72 hours. Anemia Panel: No results for input(s): VITAMINB12, FOLATE, FERRITIN, TIBC, IRON, RETICCTPCT in the last 72 hours. Urine analysis: No results found for: COLORURINE, APPEARANCEUR, LABSPEC, PHURINE, GLUCOSEU, HGBUR, BILIRUBINUR, KETONESUR, PROTEINUR, UROBILINOGEN, NITRITE, LEUKOCYTESUR Sepsis Labs: @LABRCNTIP (procalcitonin:4,lacticidven:4) ) Recent Results (from the past 240 hour(s))  SARS CORONAVIRUS 2 (TAT 6-24 HRS) Nasopharyngeal Nasopharyngeal Swab     Status: None   Collection Time: 01/23/20  8:24  PM   Specimen: Nasopharyngeal Swab  Result Value Ref Range Status   SARS Coronavirus 2 NEGATIVE NEGATIVE Final    Comment: (NOTE) SARS-CoV-2 target nucleic acids are NOT DETECTED. The SARS-CoV-2 RNA is generally detectable in upper and lower respiratory specimens during the acute phase of infection. Negative results do not preclude SARS-CoV-2 infection, do not rule out co-infections with other pathogens, and should not be used as the sole basis for treatment or other patient management decisions. Negative results must be combined with clinical observations, patient history, and epidemiological information. The expected result is Negative. Fact Sheet for Patients: SugarRoll.be Fact Sheet for Healthcare Providers: https://www.woods-mathews.com/ This test is not yet approved or cleared by the [place] FDA and  has been authorized for detection and/or diagnosis of SARS-CoV-2 by FDA under an Emergency Use Authorization (EUA). This EUA will remain  in effect (meaning  this test can be used) for the duration of the COVID-19 declaration under Section 56 4(b)(1) of the Act, 21 U.S.C. section 360bbb-3(b)(1), unless the authorization is terminated or revoked sooner. Performed at [facility], [place] [address], [place], [place] [address]      Radiological Exams on Admission: DG Chest 2 View  Result Date: 01/23/2020 CLINICAL DATA:  Chest pain EXAM: CHEST - 2 VIEW COMPARISON:  11/04/2019. FINDINGS: Mediastinum and hilar structures normal. Cardiomegaly. Borderline pulmonary venous congestion. No focal infiltrate. Mild left base subsegmental atelectasis. Calcified nodular densities again noted the right upper lung consistent granulomas. No pleural effusion or pneumothorax. Degenerative change thoracic spine. IMPRESSION: Cardiomegaly. Borderline pulmonary venous congestion infiltrate. Mild left base subsegmental atelectasis. Electronically Signed   By: [name]  Register   On: 01/23/2020 16:09    EKG: Independently reviewed.  Normal sinus rhythm and reviewed.  Assessment/Plan Principal Problem:   Chest pain Active Problems:   Morbid obesity ([place])   Essential hypertension   Diabetes mellitus type 2 in obese ([place])    1. Chest pain -given the risk factors we'll rule out ACS.  We'll keep patient n.p.o. in the morning except medication consult cardiology.  Check D-dimer.  Troponins are negative.  Since patient has severe thrombocytopenia no antiplatelet agents ordered. 2. History of chronic systolic heart failure on Lasix spironolactone and Entresto. 3. Diabetes mellitus type 2 we'll keep patient on sliding scale coverage. 4. History of cirrhosis secondary to NASH being followed at [facility]. 5. Pancytopenia likely could be from cirrhosis.  Follow CBC.   DVT prophylaxis: SCDs since patient has severe thrombocytopenia. Code Status: Full code. Family Communication: Discussed with patient. Disposition Plan: Home. Consults called: Cardiology. Admission status:  Observation.   Rise Patience MD [facility] Pager [phone].  If 7PM-7AM, please contact night-coverage [email or web address] Password [email or web address]  01/24/2020, 2:13 AM

## 2020-01-24 NOTE — Care Management Obs Status (Signed)
[place] NOTIFICATION   Patient Details  Name: Rebecca Jimenez MRN: [id number] Date of Birth: 08-May-1956   Medicare Observation Status Notification Given:  Yes    Dawayne Patricia, RN 01/24/2020, 4:11 PM

## 2020-01-24 NOTE — Progress Notes (Signed)
Patient ID: Rebecca Jimenez, female   DOB: April 09, 1956, 64 y.o.   MRN: [id number] Patient was admitted this morning for chest pain.  Cardiology consultation is pending.  Patient seen and examined at bedside and plan of care discussed with her.  I have reviewed patient's medical records including this morning's H&P, current labs, vitals and medications myself.  Still complains of intermittent chest heaviness.  Follow cardiology recommendations.

## 2020-01-24 NOTE — Consult Note (Addendum)
Cardiology Consultation:   Patient ID: Rebecca Jimenez; [id number]; 1955/11/01   Admit date: 01/23/2020 Date of Consult: 01/24/2020  Primary Care Provider: Myrlene Broker, MD Primary Cardiologist: [name], MD Primary Electrophysiologist:  None   Patient Profile:   Rebecca Jimenez is a 64 y.o. female with a chronic combined CHF (EF 35-40% 10/2019 during admission for COVID-19), HTN, DM type 2, NASH cirrhosis c/b thrombocytopenia followed by Dr. Manuella Ghazi of [facility] (confirmed on biopsy in 2014), Sjogren's syndrome, history of CVA, OSA, morbid obesity, who is being seen today for the evaluation of chest pain at the request of Dr. Starla Link.  History of Present Illness:   [name] presented to [facility] ED 01/23/20 with complaints of chest pain. She reported intermittent substernal aching chest pain for the past 24 hours, occurring both with activity and at rest. Prior to this she had been having intermittent left shoulder pain which occurred with exertion. She has DOE which has worsened over the past several days. Chest pain is associated with lightheadedness and SOB. Pain typically resolves within 5-10 minutes if she lays still. She reports this pain feels different than pain she experienced post-COVID which was sharp in nature. She presented to her PCP office for evaluation where she had an EKG which was reportedly abnormal, therefore she was referred to the ED for further evaluation. Suspect this was related to her chronic LBBB which has been present for several years.   She was last seen by cardiology at an outpatient visit with Almyra Deforest, PA-C 12/20/19 at which time she reported persistent burning chest pain ever since her COVID-19 infection which sounded like had an exertional component. She was recommended to have a low threshold to present to the hospital if symptoms worsened. The plan was to repeat an echo in 3 months to re-evaluate her LV function and consider an ischemic  evaluation at that time if EF remained low.   Her last echo was 10/2019 which showed EF 35-40%, G2DD, mild to moderate LV dilation, mild LAE, and no significant valvular abnormalities. Her last ischemic evaluation was reportedly a NST in 2017 which was without ischemia and showed EF 46%. She had a remote cardiac catheterization in 2009 which showed no significant CAD  Per Dr. 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 Manuella Ghazi with consideration to prescribe a thrombopoietin receptor agonist such as Avatrombopag should she require any surgery in the future due to risk of bleeding.   At the time of this evaluation she reports ongoing intermittent chest discomfort, mostly with activity, though occasionally occurring at rest. Does not correlate with food or specific movements. She reports weight loss of ~30lbs in the past several months, however she is up 7 lbs over the past week. She has chronic LE edema which is not significantly worse than baseline. Perhaps some worsening orthopnea. No complaints of significant palpitations or syncope. No complaints of bleeding.   Hospital course: VSS. Labs notable for K 3.7>3.5, Cr 1.19>0.99, pancytopenia with PLT 46, TSH wnl, Ddimer wnl, HsTrop negative x3. CXR with cardiomegaly and borderline pulmonary vascular congestion. EKG with sinus rhythm, rate 77 bpm, chronic LBBB. Home medications were continued. Cardiology asked to evaluate.   Past Medical History:  Diagnosis Date  . Acute on chronic combined systolic and diastolic CHF (congestive heart failure) ([place]) 11/30/2019  . Diabetes mellitus without complication ([place])   . 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 Hypertension   . Irregular heart beat   . NASH (nonalcoholic steatohepatitis)   . Sjogren's syndrome ([place])   . Sleep apnea   . Stroke [facility])     Past Surgical History:  Procedure Laterality Date  . ABDOMINAL  HYSTERECTOMY  1989  . BACK SURGERY  2000, 2007  . CHOLECYSTECTOMY    . COSMETIC SURGERY  1978   Face  . ESOPHAGOGASTRODUODENOSCOPY  11/11/2017   Grade 1 esophageal varices (too small for EVL). Moderate portal hypertensive gastropathy. Otherwise, normal EGD  . HAND SURGERY  1978, 1983  . INGUINAL HERNIA REPAIR Left   . KNEE SURGERY  2013  . ROTATOR CUFF REPAIR Left   . TONSILLECTOMY       Home Medications:  Prior to Admission medications   Medication Sig Start Date End Date Taking? Authorizing Provider  carvedilol (COREG) 12.5 MG tablet Take 1 tablet (12.5 mg total) by mouth 2 (two) times daily with a meal. 06/20/19  Yes Meng, [place], PA  Dulaglutide (TRULICITY) 2.02 RK/2.7CW SOPN Inject into the skin once a week.   Yes [provider]  furosemide (LASIX) 40 MG tablet Take 2 tablets (80 mg total) by mouth every morning AND 1 tablet (40 mg total) every evening. 05/25/19  Yes Meng, Isaac Laud, PA  glipiZIDE (GLUCOTROL) 5 MG tablet Take by mouth daily before breakfast.   Yes [provider]  potassium chloride SA (K-DUR) 20 MEQ tablet Take 1 tablet (20 mEq total) by mouth 2 (two) times daily. 05/25/19  Yes Meng, Isaac Laud, PA  sacubitril-valsartan (ENTRESTO) 49-51 MG Take 1 tablet by mouth 2 (two) times daily. 11/30/19  Yes Martinique, Peter M, MD  spironolactone (ALDACTONE) 25 MG tablet Take 0.5 tablets (12.5 mg total) by mouth daily. 11/30/19 11/24/20 Yes Martinique, Peter M, MD  Vitamin D, Ergocalciferol, (DRISDOL) 1.25 MG (50000 UNIT) CAPS capsule Take 50,000 Units by mouth every 7 (seven) days.   Yes [provider]    Inpatient Medications: Scheduled Meds: . carvedilol  12.5 mg Oral BID WC  . furosemide  80 mg Oral Daily  . insulin aspart  0-9 Units Subcutaneous TID WC  . pantoprazole  40 mg Oral Daily  . potassium chloride SA  20 mEq Oral BID  . sacubitril-valsartan  1 tablet Oral BID  . sodium chloride flush  3 mL Intravenous Once  . spironolactone  12.5 mg Oral Daily   Continuous  Infusions:  PRN Meds: alum & mag hydroxide-simeth  Allergies:    Allergies  Allergen Reactions  . Ceftriaxone Shortness Of Breath    Throat swelling  . Adenosine     Rapid heart rate  . Codeine Rash  . Morphine And Related     Unknown reaction  . Oxycodone Nausea And Vomiting  . Penicillins     Unknown reaction  . Rocephin [Ceftriaxone Sodium In Dextrose]     Swollen throat  . Sulfamethoxazole Nausea And Vomiting    Social History:   Social History   Socioeconomic History  . Marital status: Married    Spouse name: [name]  . Number of children: 2  . Years of education: S. College  . Highest education level: Not on file  Occupational History  . Occupation: Disabled  Tobacco Use  . Smoking status: Former Smoker    Packs/day: 0.25    Types: Cigarettes    Quit date: 05/11/2015    Years since quitting: 4.7  . Smokeless tobacco: Never Used  Substance and Sexual  Activity  . Alcohol use: Yes    Comment: Consumes alcohol occasionally  . Drug use: No  . Sexual activity: Not on file  Other Topics Concern  . Not on file  Social History Narrative   Patient lives at home with her Husband [name]). Patient is disabled. Patient has two children. Caffeine three cups. Left handed.    Social Determinants of Health   Financial Resource Strain:   . Difficulty of Paying Living Expenses:   Food Insecurity:   . Worried About Charity fundraiser in the Last Year:   . Arboriculturist in the Last Year:   Transportation Needs:   . Film/video editor (Medical):   [name] Lack of Transportation (Non-Medical):   Physical Activity:   . Days of Exercise per Week:   . Minutes of Exercise per Session:   Stress:   . Feeling of Stress :   Social Connections:   . Frequency of Communication with Friends and Family:   . Frequency of Social Gatherings with Friends and Family:   . Attends Religious Services:   . Active Member of Clubs or Organizations:   . Attends Archivist  Meetings:   [name] Marital Status:   Intimate Partner Violence:   . Fear of Current or Ex-Partner:   . Emotionally Abused:   [name] Physically Abused:   . Sexually Abused:     Family History:    Family History  Problem Relation Age of Onset  . Diabetes Mother   . Cerebrovascular Accident Father   . Diabetes Father   . Cancer - Prostate Father   . Heart disease Father   . Diabetes Brother      ROS:  Please see the history of present illness.  ROS  All other ROS reviewed and negative.     Physical Exam/Data:   Vitals:   01/24/20 0027 01/24/20 0313 01/24/20 0820 01/24/20 1118  BP: (!) 130/56 (!) 111/46 (!) 128/54 103/66  Pulse: 67 75 62 63  Resp: 20 15 19 15   Temp: 98 F (36.7 C) 97.9 F (36.6 C) 97.7 F (36.5 C) (!) 97.5 F (36.4 C)  TempSrc: Oral Oral Oral Oral  SpO2: 98% 96% 98% 97%  Weight:      Height:       No intake or output data in the 24 hours ending 01/24/20 1244 Filed Weights   01/23/20 1539 01/23/20 2131  Weight: 123.4 kg 123.2 kg   Body mass index is 48.11 kg/m.  General:  Morbidly obese female laying in bed in no acute distress HEENT: sclera anicteric  Neck: no JVD Vascular: No carotid bruits; distal pulses 2+ bilaterally Cardiac:  normal S1, S2; distant heart sounds, RRR; no obvious murmurs, rubs, or gallops Lungs:  clear to auscultation bilaterally, no wheezing, rhonchi or rales  Abd: NABS, soft, nontender, no hepatomegaly Ext: 1+ LE edema Musculoskeletal:  No deformities, BUE and BLE strength normal and equal Skin: warm and dry  Neuro:  CNs 2-12 intact, no focal abnormalities noted Psych:  Normal affect   EKG:  The EKG was personally reviewed and demonstrates:  Sinus rhythm, rate 77 bpm, chronic LBBB Telemetry:  Telemetry was personally reviewed and demonstrates:  Sinus rhythm with chronic LBBB  Relevant CV Studies: Echocardiogram 10/2019: Summary  1. The left ventricle is mildly to moderately dilated in size with normal wall thickness.  2.  The left ventricular systolic function is moderately decreased, LVEF is visually estimated at 35-40%.  3. There  is grade II diastolic dysfunction (elevated filling pressure).  4. The left atrium is mildly dilated in size.  5. The right ventricle is normal in size, with normal systolic function.  6. There is no pulmonary hypertension, estimated pulmonary artery systolic pressure is 14 mmHg.  Laboratory Data:  Chemistry Recent Labs  Lab 01/23/20 1542 01/24/20 0345  NA 143 145  K 3.7 3.5  CL 106 110  CO2 29 28  GLUCOSE 169* 83  BUN 19 17  CREATININE 1.19* 0.99  CALCIUM 9.3 9.3  GFRNONAA 49* >60  GFRAA 57* >60  ANIONGAP 8 7    Recent Labs  Lab 01/24/20 0345  PROT 5.6*  ALBUMIN 2.9*  AST 31  ALT 21  ALKPHOS 122  BILITOT 1.2   Hematology Recent Labs  Lab 01/23/20 1542 01/24/20 0345  WBC 2.1* 2.1*  RBC 3.76* 3.41*  HGB 11.2* 10.4*  HCT 36.7 33.0*  MCV 97.6 96.8  MCH 29.8 30.5  MCHC 30.5 31.5  RDW 14.3 14.1  PLT 47* 46*   Cardiac EnzymesNo results for input(s): TROPONINI in the last 168 hours. No results for input(s): TROPIPOC in the last 168 hours.  BNPNo results for input(s): BNP, PROBNP in the last 168 hours.  DDimer  Recent Labs  Lab 01/24/20 0345  DDIMER 0.47    Radiology/Studies:  DG Chest 2 View  Result Date: 01/23/2020 CLINICAL DATA:  Chest pain EXAM: CHEST - 2 VIEW COMPARISON:  11/04/2019. FINDINGS: Mediastinum and hilar structures normal. Cardiomegaly. Borderline pulmonary venous congestion. No focal infiltrate. Mild left base subsegmental atelectasis. Calcified nodular densities again noted the right upper lung consistent granulomas. No pleural effusion or pneumothorax. Degenerative change thoracic spine. IMPRESSION: Cardiomegaly. Borderline pulmonary venous congestion infiltrate. Mild left base subsegmental atelectasis. Electronically Signed   By: [name]  Register   On: 01/23/2020 16:09    Assessment and Plan:   1. Chest pain: patient  presented with intermittent mostly exertional chest pain. EKG with chronic LBBB. HsTrop negative x3. CXR with borderline pulmonary vascular congestion. She has an EF of 35-40% on echo 10/2019. Last ischemic evaluation was reportedly a NST in 2017 which was without ischemia. LHC in 2009 reportedly with no significant CAD. Risk factors for CAD include HTN, borderline HLD (LDL 103 05/2019), DM type 2, OSA, and morbid obesity. Her thrombocytopenia complicates matters as this would limit DAPT for management of obstructive CAD.  - Will plan for a coronary CTA to further evaluate chest pain and define coronary anatomy.  - Will update her echocardiogram - Continue risk factor modifications below  2. Chronic combined CHF: Patient with EF 35-40% on echo 10/2019 in the setting of COVID-19 PNA. Perhaps a little volume overloaded - weight is up 7lbs in the past week, some DOE, and worsening orthopnea. LE edema appears stable. Overall volume status seems stable.  - Will check BNP with AM labs - Will update her echocardiogram to see if there has been improvement in her EF.  - Continue carvedilol, entresto, spironolactone, and lasix - Continue to limit salt intake - Continue daily weights and strict I&Os  3. HTN: BP stable - Managed in the context of #2  4. Borderline HLD: LDL 103 05/2019 - Will update FLP in AM - If LDL remains >100 or CAD noted on coronary CTA, could consider addition of statin if no contraindication from a NASH perspective  5. DM type 2: A1C 9.8 10/2019; goal <7 - Consider jardiance given CHF history  6. NASH c/b thrombocytopenia: Follows with Dr.  Manuella Ghazi at [facility]. LFTs wnl on cath. PLT stable at 46 (was in the 50s-60s during admission 10/2019).  - Continue to monitor platelets closely  For questions or updates, please contact [place] Please consult [email or web address] for contact info under Cardiology/STEMI.   Signed, Abigail Butts, PA-C  01/24/2020 12:44 PM [phone]  I  have examined the patient and reviewed assessment and plan and discussed with patient.  Agree with above as stated.  Patient with coronary calcifications on prior chest CT.  Some features are Atypical regarding the chest pain symptoms but she has multiple risk factors for CAD.  She also had a low EF based on an echo which can be seen in care everywhere.  Need to rule out ischemia as a possible cause.  Repeat echo is scheduled in June at our office.  If there is severe CAD, will need to manage her thrombocytopenia before considering any intervention.      Larae Grooms

## 2020-01-25 ENCOUNTER — Other Ambulatory Visit (HOSPITAL_COMMUNITY): Payer: Medicare PPO

## 2020-01-25 DIAGNOSIS — R079 Chest pain, unspecified: Secondary | ICD-10-CM | POA: Diagnosis not present

## 2020-01-25 DIAGNOSIS — E669 Obesity, unspecified: Secondary | ICD-10-CM

## 2020-01-25 DIAGNOSIS — R072 Precordial pain: Secondary | ICD-10-CM | POA: Diagnosis not present

## 2020-01-25 DIAGNOSIS — E1169 Type 2 diabetes mellitus with other specified complication: Secondary | ICD-10-CM

## 2020-01-25 DIAGNOSIS — I10 Essential (primary) hypertension: Secondary | ICD-10-CM

## 2020-01-25 LAB — CBC WITH DIFFERENTIAL/PLATELET
Abs Immature Granulocytes: 0.01 10*3/uL (ref 0.00–0.07)
Basophils Absolute: 0 10*3/uL (ref 0.0–0.1)
Basophils Relative: 1 %
Eosinophils Absolute: 0.1 10*3/uL (ref 0.0–0.5)
Eosinophils Relative: 3 %
HCT: 32.5 % — ABNORMAL LOW (ref 36.0–46.0)
Hemoglobin: 10.3 g/dL — ABNORMAL LOW (ref 12.0–15.0)
Immature Granulocytes: 1 %
Lymphocytes Relative: 37 %
Lymphs Abs: 0.8 10*3/uL (ref 0.7–4.0)
MCH: 30.3 pg (ref 26.0–34.0)
MCHC: 31.7 g/dL (ref 30.0–36.0)
MCV: 95.6 fL (ref 80.0–100.0)
Monocytes Absolute: 0.2 10*3/uL (ref 0.1–1.0)
Monocytes Relative: 9 %
Neutro Abs: 1.1 10*3/uL — ABNORMAL LOW (ref 1.7–7.7)
Neutrophils Relative %: 49 %
Platelets: 48 10*3/uL — ABNORMAL LOW (ref 150–400)
RBC: 3.4 MIL/uL — ABNORMAL LOW (ref 3.87–5.11)
RDW: 14.1 % (ref 11.5–15.5)
WBC: 2.2 10*3/uL — ABNORMAL LOW (ref 4.0–10.5)
nRBC: 0 % (ref 0.0–0.2)

## 2020-01-25 LAB — COMPREHENSIVE METABOLIC PANEL
ALT: 21 U/L (ref 0–44)
AST: 30 U/L (ref 15–41)
Albumin: 2.9 g/dL — ABNORMAL LOW (ref 3.5–5.0)
Alkaline Phosphatase: 123 U/L (ref 38–126)
Anion gap: 6 (ref 5–15)
BUN: 17 mg/dL (ref 8–23)
CO2: 26 mmol/L (ref 22–32)
Calcium: 9.1 mg/dL (ref 8.9–10.3)
Chloride: 110 mmol/L (ref 98–111)
Creatinine, Ser: 1.15 mg/dL — ABNORMAL HIGH (ref 0.44–1.00)
GFR calc Af Amer: 59 mL/min — ABNORMAL LOW (ref >60)
GFR calc non Af Amer: 51 mL/min — ABNORMAL LOW (ref >60)
Glucose, Bld: 85 mg/dL (ref 70–99)
Potassium: 3.9 mmol/L (ref 3.5–5.1)
Sodium: 142 mmol/L (ref 135–145)
Total Bilirubin: 1.5 mg/dL — ABNORMAL HIGH (ref 0.3–1.2)
Total Protein: 5.7 g/dL — ABNORMAL LOW (ref 6.5–8.1)

## 2020-01-25 LAB — LIPID PANEL
Cholesterol: 182 mg/dL (ref 0–200)
HDL: 42 mg/dL (ref >40)
LDL Cholesterol: 114 mg/dL — ABNORMAL HIGH (ref 0–99)
Total CHOL/HDL Ratio: 4.3 RATIO
Triglycerides: 131 mg/dL (ref <150)
VLDL: 26 mg/dL (ref 0–40)

## 2020-01-25 LAB — GLUCOSE, CAPILLARY
Glucose-Capillary: 95 mg/dL (ref 70–99)
Glucose-Capillary: 122 mg/dL — ABNORMAL HIGH (ref 70–99)

## 2020-01-25 LAB — MAGNESIUM: Magnesium: 2 mg/dL (ref 1.7–2.4)

## 2020-01-25 LAB — BRAIN NATRIURETIC PEPTIDE: B Natriuretic Peptide: 85.6 pg/mL (ref 0.0–100.0)

## 2020-01-25 LAB — HEMOGLOBIN A1C
Hgb A1c MFr Bld: 6 % — ABNORMAL HIGH (ref 4.8–5.6)
Mean Plasma Glucose: 125.5 mg/dL

## 2020-01-25 MED ORDER — ROSUVASTATIN CALCIUM 5 MG PO TABS: 10.0000 mg | ORAL_TABLET | Freq: Every day | ORAL | Status: DC

## 2020-01-25 MED ORDER — ROSUVASTATIN CALCIUM 10 MG PO TABS: 10.0000 mg | ORAL_TABLET | Freq: Every day | ORAL | 0 refills | Status: DC

## 2020-01-25 NOTE — Progress Notes (Addendum)
Progress Note  Patient Name: Rebecca Jimenez Date of Encounter: 01/25/2020  Primary Cardiologist: [name], MD   Subjective   Thinks her pain may be a little better this morning. Questions whether her chest pain is related to her COVID infection in February as that seems to be when symptoms started.   Inpatient Medications    Scheduled Meds: . carvedilol  12.5 mg Oral BID WC  . furosemide  80 mg Oral Daily  . insulin aspart  0-9 Units Subcutaneous TID WC  . metoprolol tartrate  50 mg Oral Once  . pantoprazole  40 mg Oral Daily  . potassium chloride SA  20 mEq Oral BID  . sacubitril-valsartan  1 tablet Oral BID  . sodium chloride flush  3 mL Intravenous Once  . spironolactone  12.5 mg Oral Daily   Continuous Infusions:  PRN Meds: alum & mag hydroxide-simeth   Vital Signs    Vitals:   01/24/20 2007 01/25/20 0005 01/25/20 0416 01/25/20 0743  BP: (!) 115/51 (!) 137/47 (!) 112/54 (!) 127/52  Pulse: 63 65 65 65  Resp: 17 17 20 16   Temp:  97.9 F (36.6 C) 98.1 F (36.7 C) 97.8 F (36.6 C)  TempSrc:  Oral Oral Oral  SpO2: 100% 96% 96% 97%  Weight:      Height:       No intake or output data in the 24 hours ending 01/25/20 0852 Filed Weights   01/23/20 1539 01/23/20 2131  Weight: 123.4 kg 123.2 kg    Telemetry    Sinus rhythm with LBBB - Personally Reviewed  ECG    No new tracings - Personally Reviewed  Physical Exam   GEN: Sitting on the edge of her bed finishing breakfast in NAD   Neck: No JVD, no carotid bruits Cardiac: RRR, no murmurs, rubs, or gallops.  Respiratory: Clear to auscultation bilaterally, no wheezes/ rales/ rhonchi GI: NABS, obese, Soft, nontender, non-distended  MS: 1+ LE edema; chronic venous stasis skin changes; No deformity. Neuro:  Nonfocal, moving all extremities spontaneously Psych: Normal affect   Labs    Chemistry Recent Labs  Lab 01/23/20 1542 01/24/20 0345 01/25/20 0245  NA 143 145 142  K 3.7 3.5 3.9  CL 106  110 110  CO2 29 28 26   GLUCOSE 169* 83 85  BUN 19 17 17   CREATININE 1.19* 0.99 1.15*  CALCIUM 9.3 9.3 9.1  PROT  --  5.6* 5.7*  ALBUMIN  --  2.9* 2.9*  AST  --  31 30  ALT  --  21 21  ALKPHOS  --  122 123  BILITOT  --  1.2 1.5*  GFRNONAA 49* >60 51*  GFRAA 57* >60 59*  ANIONGAP 8 7 6      Hematology Recent Labs  Lab 01/23/20 1542 01/24/20 0345 01/25/20 0245  WBC 2.1* 2.1* 2.2*  RBC 3.76* 3.41* 3.40*  HGB 11.2* 10.4* 10.3*  HCT 36.7 33.0* 32.5*  MCV 97.6 96.8 95.6  MCH 29.8 30.5 30.3  MCHC 30.5 31.5 31.7  RDW 14.3 14.1 14.1  PLT 47* 46* 48*    Cardiac EnzymesNo results for input(s): TROPONINI in the last 168 hours. No results for input(s): TROPIPOC in the last 168 hours.   BNP Recent Labs  Lab 01/25/20 0245  BNP 85.6     DDimer  Recent Labs  Lab 01/24/20 0345  DDIMER 0.47     Radiology    DG Chest 2 View  Result Date: 01/23/2020 CLINICAL DATA:  Chest pain EXAM: CHEST - 2 VIEW COMPARISON:  11/04/2019. FINDINGS: Mediastinum and hilar structures normal. Cardiomegaly. Borderline pulmonary venous congestion. No focal infiltrate. Mild left base subsegmental atelectasis. Calcified nodular densities again noted the right upper lung consistent granulomas. No pleural effusion or pneumothorax. Degenerative change thoracic spine. IMPRESSION: Cardiomegaly. Borderline pulmonary venous congestion infiltrate. Mild left base subsegmental atelectasis. Electronically Signed   By: [name]  Register   On: 01/23/2020 16:09   CT CORONARY MORPH W/CTA COR W/SCORE W/CA W/CM &/OR WO/CM  Addendum Date: 01/25/2020   ADDENDUM REPORT: 01/25/2020 07:54 EXAM: OVER-READ INTERPRETATION  CT CHEST The following report is an over-read performed by radiologist Dr. Rebekah Chesterfield [facility] on 01/25/2020. This over-read does not include interpretation of cardiac or coronary anatomy or pathology. The coronary calcium score and cardiac CTA interpretation by the cardiologist is attached.  COMPARISON:  No priors. FINDINGS: Within the visualized portions of the thorax there are no suspicious appearing pulmonary nodules or masses, there is no acute consolidative airspace disease, no pleural effusions, no pneumothorax and no lymphadenopathy. Visualized portions of the upper upper abdomen demonstrates a small volume of perihepatic ascites. There are no aggressive appearing lytic or blastic lesions noted in the visualized portions of the skeleton. IMPRESSION: 1. Small volume of perihepatic ascites. Electronically Signed   By: Vinnie Langton M.D.   On: 01/25/2020 07:54   Result Date: 01/25/2020 HISTORY: 64 yo female with heart failure, systolic, further testing EXAM: Cardiac/Coronary CTA TECHNIQUE: The patient was scanned on a [employer]. PROTOCOL: A 120 kV prospective scan was triggered in the descending thoracic aorta at 111 HU's. Axial non-contrast 3 mm slices were carried out through the heart. The data set was analyzed on a dedicated work station and scored using the [place]. Gantry rotation speed was 250 msecs and collimation was .6 mm. Beta blockade and 0.8 mg of sl NTG was given. The 3D data set was reconstructed in 5% intervals of the 67-82 % of the R-R cycle. Diastolic phases were analyzed on a dedicated work station using MPR, MIP and VRT modes. The patient received 169m OMNIPAQUE IOHEXOL 350 MG/ML SOLN of contrast. FINDINGS: Quality: Fair (HR 68), there is mis-registration artifact affecting the proximal vessels Coronary calcium score: The patient's coronary artery calcium score is 40, which places the patient in the 80th percentile. Coronary arteries: Normal coronary origins.  Right dominance. Right Coronary Artery: Dominant vessel. Minimal 1-24% mixed proximal stenosis (CADRADS1). Proximal mis-registration artifact, the remainder of the vessel is without stenosis. Left Main Coronary Artery: Normal. Bifurcates into the LAD and LCX arteries. Left Anterior Descending  Coronary Artery: Large vessel that extends to the apex. There are 3 diagonal branches, including a large D1 and 2 smaller vessels. The LAD proper does not appear to have any significant disease. There is mis-registration artifact of the mid-vessel. The large D1 branch has a punctate proximal calcification of 1-24% mixed stenosis (CADRADS1). Left Circumflex Artery: Large vessel with minimal 1-24% proximal mixed stenosis (CADRADS1). Mis-registration artifact in the mid-vessel. The distal vessel demonstrates no stenosis. Aorta: Normal size, 25 mm at the mid ascending aorta (level of the PA bifurcation) measured double oblique. No calcifications. No dissection. Aortic Valve: Trileaflet.  No calcifications. Other findings: Normal pulmonary vein drainage into the left atrium. Normal left atrial appendage without a thrombus. Normal size of the pulmonary artery. IMPRESSION: 1. Mild mixed non-obstructive CAD, CADRADS = 1. 2. Coronary calcium score of 40. This was 80th percentile for age and sex matched control.  3. Normal coronary origin with right dominance. Electronically Signed: By: Pixie Casino M.D. On: 01/24/2020 19:39    Cardiac Studies   Coronary CTA 01/24/20: IMPRESSION: 1. Mild mixed non-obstructive CAD, CADRADS = 1.  2. Coronary calcium score of 40. This was 80th percentile for age and sex matched control.  3. Normal coronary origin with right dominance.   Patient Profile     64 y.o. female with a chronic combined CHF (EF 35-40% 10/2019 during admission for COVID-19), HTN, DM type 2, NASH cirrhosis c/b thrombocytopenia followed by Dr. Manuella Ghazi of [facility] (confirmed on biopsy in 2014), Sjogren's syndrome, history of CVA, OSA, morbid obesity who is being followed by cardiology for chest pain.  Assessment & Plan    1. Chest pain: patient presented with intermittent mostly exertional chest pain. EKG with chronic LBBB. HsTrop negative x3. CXR with borderline pulmonary vascular congestion. She  has an EF of 35-40% on echo 10/2019. Last ischemic evaluation was reportedly a NST in 2017 which was without ischemia. LHC in 2009 reportedly with no significant CAD. Risk factors for CAD include HTN, borderline HLD (LDL 103 05/2019), DM type 2, OSA, and morbid obesity. Her thrombocytopenia complicates matters as this would limit DAPT for management of obstructive CAD. Thankfully her coronary CTA showed only mild diffuse CAD without any significant obstruction.  - Continue risk factor modifications below  2. Chronic combined CHF: Patient with EF 35-40% on echo 10/2019 in the setting of COVID-19 PNA. Perhaps a little volume overloaded - weight is up 7lbs in the past week, some DOE, and worsening orthopnea. LE edema appears stable. Overall volume status seems stable. BNP wnl.  - Will update her echocardiogram to see if there has been improvement in her EF, though do not anticipate this will change management at this time.  - Continue carvedilol, entresto, spironolactone, and lasix - Continue to limit salt intake - Continue daily weights and strict I&Os - Encouraged compression stockings.   3. HTN: BP stable - Managed in the context of #2  4. HLD: LDL 114 on labs today. She does have mild CAD on coronary CTA this admission. Goal LDL <70. LFTs are normal at this time.  - Favor starting crestor 64m daily - Will need repeat FLP/LFTs in 6-8 weeks.   5. DM type 2: A1C 9.8 10/2019; goal <7 - Will update A1C this morning - Consider SGLT2-i given CHF history if A1C remains above goal  6. NASH c/b thrombocytopenia: [facility] on cath. PLT stable in the 40s this admission (was in the 50s-60s during admission 10/2019).  - Continue to monitor platelets closely  Will arrange outpatient follow-up with Dr. JMartinique HAlmyra Deforest or myself in the next 2-3 weeks.   For questions or updates, please contact CProvidencePlease consult [email or web address] for contact info under  Cardiology/STEMI.      Signed, KAbigail Butts PA-C  01/25/2020, 8:52 AM   [phone] I have examined the patient and reviewed assessment and plan and discussed with patient.  Agree with above as stated.  Calcium score in the 80th percentile.  We will add rosuvastatin 10 mg daily.  Plan for echocardiogram as outpatient in June to see if her LVEF has improved on medical therapy.  Doing an echo today may be a little too early.  We talked about risk factor modification in general.  Okay to discharge from cardiology standpoint.  JLarae Grooms

## 2020-01-25 NOTE — Discharge Summary (Signed)
Physician Discharge Summary  Rebecca Jimenez CZY:[id number] DOB: 10/05/1955 DOA: 01/23/2020  PCP: Myrlene Broker, MD  Admit date: 01/23/2020 Discharge date: 01/25/2020  Admitted From: Home Disposition: Home  Recommendations for Outpatient Follow-up:  1. Follow up with PCP in 1 week with repeat CBC/BMP 2. Outpatient follow-up with cardiology 3. Follow up in ED if symptoms worsen or new appear   Home Health: No Equipment/Devices: None  Discharge Condition: Stable CODE STATUS: Full Diet recommendation: Heart healthy/carb modified/fluid restriction of up to 1200 cc a day  Brief/Interim Summary: 64 year old female with chronic systolic heart failure, cirrhosis of liver, pancytopenia, diabetes mellitus type 2, Sjogren's syndrome, sleep apnea, unspecified stroke presented with chest pain.  Cardiology was consulted.  Covid testing was negative.  Troponins have been negative during the hospitalization.  Coronary CTA shows only mild diffuse CAD without any obstruction.  Echo is pending.  Cardiology has cleared the patient for discharge with outpatient follow-up with cardiology.  Discharge Diagnoses:   Chest pain -Troponins have been negative so far. -Coronary CTA shows only mild diffuse CAD without any significant obstruction.  Echo is pending.  Cardiology has cleared the patient for discharge with outpatient follow-up with cardiology.  Chronic systolic heart failure -Currently stable.  Echo pending.  Continue Coreg, Entresto, spironolactone and Lasix on discharge.  Fluid restriction.  Outpatient follow-up with cardiology.  Hypertension -Monitor blood pressure.  Outpatient follow-up.  Antihypertensive plan as above  Hyperlipidemia -LDL 114.  Crestor has been started by cardiology which will be continued on discharge  Diabetes mellitus type 2 -A1c is 6 -Carb modified diet.  Continue home regimen.  Outpatient follow-up with PCP   History of cirrhosis secondary to  NASH Pancytopenia -Outpatient follow-up with GI at [facility] -No signs of bleeding  Morbid obesity -Outpatient follow-up   Discharge Instructions  Discharge Instructions    Ambulatory referral to Cardiology   Complete by: As directed    Diet - low sodium heart healthy   Complete by: As directed    Diet Carb Modified   Complete by: As directed    Increase activity slowly   Complete by: As directed      Allergies as of 01/25/2020      Reactions   Ceftriaxone Shortness Of Breath   Throat swelling   Adenosine    Rapid heart rate   Codeine Rash   Morphine And Related    Unknown reaction   Oxycodone Nausea And Vomiting   Penicillins    Unknown reaction   Rocephin [ceftriaxone Sodium In Dextrose]    Swollen throat   Sulfamethoxazole Nausea And Vomiting      Medication List    TAKE these medications   carvedilol 12.5 MG tablet Commonly known as: COREG Take 1 tablet (12.5 mg total) by mouth 2 (two) times daily with a meal.   Entresto 49-51 MG Generic drug: sacubitril-valsartan Take 1 tablet by mouth 2 (two) times daily.   furosemide 40 MG tablet Commonly known as: LASIX Take 2 tablets (80 mg total) by mouth every morning AND 1 tablet (40 mg total) every evening.   glipiZIDE 5 MG tablet Commonly known as: GLUCOTROL Take by mouth daily before breakfast.   potassium chloride SA 20 MEQ tablet Commonly known as: KLOR-CON Take 1 tablet (20 mEq total) by mouth 2 (two) times daily.   rosuvastatin 10 MG tablet Commonly known as: CRESTOR Take 1 tablet (10 mg total) by mouth daily.   spironolactone 25 MG tablet Commonly known as: ALDACTONE Take 0.5  tablets (12.5 mg total) by mouth daily.   Trulicity 1.01 BP/1.0CH Sopn Generic drug: Dulaglutide Inject into the skin once a week.   Vitamin D (Ergocalciferol) 1.25 MG (50000 UNIT) Caps capsule Commonly known as: DRISDOL Take 50,000 Units by mouth every 7 (seven) days.      Follow-up Information    Almyra Deforest, [place] Follow  up on 02/14/2020.   Specialties: Cardiology, Radiology Why: Please arrive 15 minutes early for your 2:15pm post-hospital cardiology follow-up appointment Contact information: [address] [place] [address] [phone]        Myrlene Broker, MD. Schedule an appointment as soon as possible for a visit in 1 week(s).   Specialty: Family Medicine Contact information: [place] [address] [phone]        Martinique, Peter M, MD .   Specialty: Cardiology Contact information: [address] [place] [address] [phone]          Allergies  Allergen Reactions  . Ceftriaxone Shortness Of Breath    Throat swelling  . Adenosine     Rapid heart rate  . Codeine Rash  . Morphine And Related     Unknown reaction  . Oxycodone Nausea And Vomiting  . Penicillins     Unknown reaction  . Rocephin [Ceftriaxone Sodium In Dextrose]     Swollen throat  . Sulfamethoxazole Nausea And Vomiting    Consultations:  Cardiology   Procedures/Studies: DG Chest 2 View  Result Date: 01/23/2020 CLINICAL DATA:  Chest pain EXAM: CHEST - 2 VIEW COMPARISON:  11/04/2019. FINDINGS: Mediastinum and hilar structures normal. Cardiomegaly. Borderline pulmonary venous congestion. No focal infiltrate. Mild left base subsegmental atelectasis. Calcified nodular densities again noted the right upper lung consistent granulomas. No pleural effusion or pneumothorax. Degenerative change thoracic spine. IMPRESSION: Cardiomegaly. Borderline pulmonary venous congestion infiltrate. Mild left base subsegmental atelectasis. Electronically Signed   By: [name]  Register   On: 01/23/2020 16:09   CT CORONARY MORPH W/CTA COR W/SCORE W/CA W/CM &/OR WO/CM  Addendum Date: 01/25/2020   ADDENDUM REPORT: 01/25/2020 07:54 EXAM: OVER-READ INTERPRETATION  CT CHEST The following report is an over-read performed by radiologist Dr. Rebekah Chesterfield [facility]  on 01/25/2020. This over-read does not include interpretation of cardiac or coronary anatomy or pathology. The coronary calcium score and cardiac CTA interpretation by the cardiologist is attached. COMPARISON:  No priors. FINDINGS: Within the visualized portions of the thorax there are no suspicious appearing pulmonary nodules or masses, there is no acute consolidative airspace disease, no pleural effusions, no pneumothorax and no lymphadenopathy. Visualized portions of the upper upper abdomen demonstrates a small volume of perihepatic ascites. There are no aggressive appearing lytic or blastic lesions noted in the visualized portions of the skeleton. IMPRESSION: 1. Small volume of perihepatic ascites. Electronically Signed   By: Vinnie Langton M.D.   On: 01/25/2020 07:54   Result Date: 01/25/2020 HISTORY: 64 yo female with heart failure, systolic, further testing EXAM: Cardiac/Coronary CTA TECHNIQUE: The patient was scanned on a [employer]. PROTOCOL: A 120 kV prospective scan was triggered in the descending thoracic aorta at 111 HU's. Axial non-contrast 3 mm slices were carried out through the heart. The data set was analyzed on a dedicated work station and scored using the [place]. Gantry rotation speed was 250 msecs and collimation was .6 mm. Beta blockade and 0.8 mg of sl NTG was given. The 3D data set was reconstructed in 5% intervals  of the 67-82 % of the R-R cycle. Diastolic phases were analyzed on a dedicated work station using MPR, MIP and VRT modes. The patient received 173m OMNIPAQUE IOHEXOL 350 MG/ML SOLN of contrast. FINDINGS: Quality: Fair (HR 68), there is mis-registration artifact affecting the proximal vessels Coronary calcium score: The patient's coronary artery calcium score is 40, which places the patient in the 80th percentile. Coronary arteries: Normal coronary origins.  Right dominance. Right Coronary Artery: Dominant vessel. Minimal 1-24% mixed proximal stenosis  (CADRADS1). Proximal mis-registration artifact, the remainder of the vessel is without stenosis. Left Main Coronary Artery: Normal. Bifurcates into the LAD and LCX arteries. Left Anterior Descending Coronary Artery: Large vessel that extends to the apex. There are 3 diagonal branches, including a large D1 and 2 smaller vessels. The LAD proper does not appear to have any significant disease. There is mis-registration artifact of the mid-vessel. The large D1 branch has a punctate proximal calcification of 1-24% mixed stenosis (CADRADS1). Left Circumflex Artery: Large vessel with minimal 1-24% proximal mixed stenosis (CADRADS1). Mis-registration artifact in the mid-vessel. The distal vessel demonstrates no stenosis. Aorta: Normal size, 25 mm at the mid ascending aorta (level of the PA bifurcation) measured double oblique. No calcifications. No dissection. Aortic Valve: Trileaflet.  No calcifications. Other findings: Normal pulmonary vein drainage into the left atrium. Normal left atrial appendage without a thrombus. Normal size of the pulmonary artery. IMPRESSION: 1. Mild mixed non-obstructive CAD, CADRADS = 1. 2. Coronary calcium score of 40. This was 80th percentile for age and sex matched control. 3. Normal coronary origin with right dominance. Electronically Signed: By: KPixie CasinoM.D. On: 01/24/2020 19:39       Subjective: Patient seen and examined at bedside.  She feels much better and does not complain of any more chest pain.  She was to go home.  No overnight fever, nausea or vomiting reported.  Discharge Exam: Vitals:   01/25/20 0416 01/25/20 0743  BP: (!) 112/54 (!) 127/52  Pulse: 65 65  Resp: 20 16  Temp: 98.1 F (36.7 C) 97.8 F (36.6 C)  SpO2: 96% 97%    General: Pt is alert, awake, not in acute distress Cardiovascular: rate controlled, S1/S2 + Respiratory: bilateral decreased breath sounds at bases Abdominal: Soft, morbidly obese, NT, ND, bowel sounds + Extremities: Trace  lower extremity bilateral edema, no cyanosis    The results of significant diagnostics from this hospitalization (including imaging, microbiology, ancillary and laboratory) are listed below for reference.     Microbiology: Recent Results (from the past 240 hour(s))  SARS CORONAVIRUS 2 (TAT 6-24 HRS) Nasopharyngeal Nasopharyngeal Swab     Status: None   Collection Time: 01/23/20  8:24 PM   Specimen: Nasopharyngeal Swab  Result Value Ref Range Status   SARS Coronavirus 2 NEGATIVE NEGATIVE Final    Comment: (NOTE) SARS-CoV-2 target nucleic acids are NOT DETECTED. The SARS-CoV-2 RNA is generally detectable in upper and lower respiratory specimens during the acute phase of infection. Negative results do not preclude SARS-CoV-2 infection, do not rule out co-infections with other pathogens, and should not be used as the sole basis for treatment or other patient management decisions. Negative results must be combined with clinical observations, patient history, and epidemiological information. The expected result is Negative. Fact Sheet for Patients: hSugarRoll.beFact Sheet for Healthcare Providers: hhttps://www.woods-mathews.com/This test is not yet approved or cleared by the [place] and  has been authorized for detection and/or diagnosis of SARS-CoV-2 by FDA under an Emergency Use  Authorization (EUA). This EUA will remain  in effect (meaning this test can be used) for the duration of the COVID-19 declaration under Section 56 4(b)(1) of the Act, 21 U.S.C. section 360bbb-3(b)(1), unless the authorization is terminated or revoked sooner. Performed at [facility], [place] [address], [place], [place] [address]      Labs: BNP (last 3 results) Recent Labs    01/25/20 0245  BNP 10.6   Basic Metabolic Panel: Recent Labs  Lab 01/23/20 1542 01/24/20 0345 01/25/20 0245  NA 143 145 142  K 3.7 3.5 3.9  CL 106 110 110  CO2 29 28  26   GLUCOSE 169* 83 85  BUN 19 17 17   CREATININE 1.19* 0.99 1.15*  CALCIUM 9.3 9.3 9.1  MG  --  2.2 2.0   Liver Function Tests: Recent Labs  Lab 01/24/20 0345 01/25/20 0245  AST 31 30  ALT 21 21  ALKPHOS 122 123  BILITOT 1.2 1.5*  PROT 5.6* 5.7*  ALBUMIN 2.9* 2.9*   No results for input(s): LIPASE, AMYLASE in the last 168 hours. No results for input(s): AMMONIA in the last 168 hours. CBC: Recent Labs  Lab 01/23/20 1542 01/24/20 0345 01/25/20 0245  WBC 2.1* 2.1* 2.2*  NEUTROABS  --  1.1* 1.1*  HGB 11.2* 10.4* 10.3*  HCT 36.7 33.0* 32.5*  MCV 97.6 96.8 95.6  PLT 47* 46* 48*   Cardiac Enzymes: No results for input(s): CKTOTAL, CKMB, CKMBINDEX, TROPONINI in the last 168 hours. BNP: Invalid input(s): POCBNP CBG: Recent Labs  Lab 01/24/20 1133 01/24/20 1623 01/24/20 2137 01/25/20 0643 01/25/20 1048  GLUCAP 122* 100* 86 95 122*   D-Dimer Recent Labs    01/24/20 0345  DDIMER 0.47   Hgb A1c Recent Labs    01/25/20 0245  HGBA1C 6.0*   Lipid Profile Recent Labs    01/25/20 0245  CHOL 182  HDL 42  LDLCALC 114*  TRIG 131  CHOLHDL 4.3   Thyroid function studies Recent Labs    01/24/20 0345  TSH 2.617   Anemia work up No results for input(s): VITAMINB12, FOLATE, FERRITIN, TIBC, IRON, RETICCTPCT in the last 72 hours. Urinalysis No results found for: COLORURINE, APPEARANCEUR, [place], [place], [place], [place], [place], [place], PROTEINUR, UROBILINOGEN, NITRITE, LEUKOCYTESUR Sepsis Labs Invalid input(s): PROCALCITONIN,  WBC,  LACTICIDVEN Microbiology Recent Results (from the past 240 hour(s))  SARS CORONAVIRUS 2 (TAT 6-24 HRS) Nasopharyngeal Nasopharyngeal Swab     Status: None   Collection Time: 01/23/20  8:24 PM   Specimen: Nasopharyngeal Swab  Result Value Ref Range Status   SARS Coronavirus 2 NEGATIVE NEGATIVE Final    Comment: (NOTE) SARS-CoV-2 target nucleic acids are NOT DETECTED. The SARS-CoV-2 RNA is generally detectable in upper  and lower respiratory specimens during the acute phase of infection. Negative results do not preclude SARS-CoV-2 infection, do not rule out co-infections with other pathogens, and should not be used as the sole basis for treatment or other patient management decisions. Negative results must be combined with clinical observations, patient history, and epidemiological information. The expected result is Negative. Fact Sheet for Patients: SugarRoll.be Fact Sheet for Healthcare Providers: https://www.woods-mathews.com/ This test is not yet approved or cleared by the [place] FDA and  has been authorized for detection and/or diagnosis of SARS-CoV-2 by FDA under an Emergency Use Authorization (EUA). This EUA will remain  in effect (meaning this test can be used) for the duration of the COVID-19 declaration under Section 56 4(b)(1) of the Act, 21 U.S.C.  section 360bbb-3(b)(1), unless the authorization is terminated or revoked sooner. Performed at [facility], [place] [address] [place], [place] [address]      Time coordinating discharge: 35 minutes  SIGNED:   Aline August, MD  [facility] 01/25/2020, 11:06 AM

## 2020-01-25 NOTE — Care Management (Signed)
Per Lewis M w/ [facility] : Co-pay amount for Jardiance 10mg. daily  for 30 day supply $47.00  Co-Pay amount for Farxiga 10 mg.daily for a 30 day supply $260.00  NO PA required Deductible not met Tier 3 for Jardiance, Tier 4 for Farxiga  [facility]ail pharmacy : [facility] 

## 2020-02-14 ENCOUNTER — Encounter: Payer: Self-pay | Admitting: Physician Assistant

## 2020-02-14 ENCOUNTER — Ambulatory Visit: Payer: Medicare PPO | Admitting: Physician Assistant

## 2020-02-14 ENCOUNTER — Other Ambulatory Visit: Payer: Self-pay

## 2020-02-14 VITALS — BP 122/68 | HR 77 | Temp 97.0°F | Ht 63.0 in | Wt 272.0 lb

## 2020-02-14 DIAGNOSIS — I10 Essential (primary) hypertension: Secondary | ICD-10-CM | POA: Diagnosis not present

## 2020-02-14 DIAGNOSIS — R0789 Other chest pain: Secondary | ICD-10-CM

## 2020-02-14 DIAGNOSIS — I428 Other cardiomyopathies: Secondary | ICD-10-CM

## 2020-02-14 DIAGNOSIS — E119 Type 2 diabetes mellitus without complications: Secondary | ICD-10-CM

## 2020-02-14 DIAGNOSIS — Z79899 Other long term (current) drug therapy: Secondary | ICD-10-CM

## 2020-02-14 MED ORDER — ENTRESTO 97-103 MG PO TABS: 1.0000 | ORAL_TABLET | Freq: Two times a day (BID) | ORAL | 11 refills | Status: DC

## 2020-02-14 NOTE — Patient Instructions (Signed)
Medication Instructions:  INCREASE- Entresto 97/103 by mouth twice a day  *If you need a refill on your cardiac medications before your next appointment, please call your pharmacy*   Lab Work: [facility] If you have labs (blood work) drawn today and your tests are completely normal, you will receive your results only by: [name] MyChart Message (if you have MyChart) OR . A paper copy in the mail If you have any lab test that is abnormal or we need to change your treatment, we will call you to review the results.   Testing/Procedures: None Ordered   Follow-Up: At [facility], you and your health needs are our priority.  As part of our continuing mission to provide you with exceptional heart care, we have created designated Provider Care Teams.  These Care Teams include your primary Cardiologist (physician) and Advanced Practice Providers (APPs -  Physician Assistants and Nurse Practitioners) who all work together to provide you with the care you need, when you need it.  We recommend signing up for the patient portal called "MyChart".  Sign up information is provided on this After Visit Summary.  MyChart is used to connect with patients for Virtual Visits (Telemedicine).  Patients are able to view lab/test results, encounter notes, upcoming appointments, etc.  Non-urgent messages can be sent to your provider as well.   To learn more about what you can do with MyChart, go to [email or web address].    Your next appointment:   July with Dr Martinique  The format for your next appointment:   In Person

## 2020-02-14 NOTE — Progress Notes (Signed)
Cardiology Office Note:    Date:  02/16/2020   ID:  Rebecca, Jimenez 1956/04/25, MRN [id number]  PCP:  Myrlene Broker, MD  Cardiologist:  [name], MD  Electrophysiologist:  None   Referring MD: Myrlene Broker, MD   Chief Complaint  Patient presents with  . Follow-up    seen for Dr. Martinique.    History of Present Illness:    Rebecca Jimenez is a 64 y.o. female with a hx of Sjogren's syndrome,HTN, DM 2,NASHcirrhosis [facility](confirmed on [facility] 2014), PVCs,NICM, OSA and history of CVA. She had a remote cardiac catheterization in 2009 which showed no significant CAD. Myoview in September 2017 showed EF 46%, no ischemia. Echocardiogram showed global hypokinesis with borderline LV dysfunction. She also has a chronic left bundle branch block. 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 (She would require clearance from liver specialist and thrombopoietin receptoragonist such as Avatrombopagshould she require any surgery in the future due to risk of bleeding) Patient is followed by her liver specialist at [place].  I saw the patient in September 2020 for volume overload and weight gain.  Lasix was increased to 80 mg a.m. and 40 mg p.m.  She had a significant weight loss and was breathing better afterward.  She was admitted in [facility] in early February 2021 with COVID-19 pneumonia.  Echocardiogram at the time showed EF 35 to 40%.  Chest x-ray showed diffuse pulmonary infiltrate.  When she was seen by Dr. Martinique virtually in March, she has gained 3 pounds.  Lasix was increased to 80 mg twice daily for 3 days before going back to the 80 mg a.m. and 40 mg p.m.  Losartan was also switched to Entresto twice a day.  Aldactone was added to her medical regimen.  I last saw the patient on 12/20/2019 and recommend a repeat echocardiogram in June.  She was admitted in May 2021 with  chest pain.  Coronary CT performed on 01/24/2020 showed coronary calcium score of 40 which placed the patient at 47 percentile for age and sex matched control, mild mixed nonobstructive CAD.  Patient presents today for cardiology office visit.  She continued to have some intermittent chest pain however this is worse when she takes a deep breath.  This is likely still related to inflammation either musculoskeletal or pleurisy associated with COVID-19 infection.  Her blood pressure is normal, I recommend increasing the Entresto to 97-103 mg twice daily.  She will need a basic metabolic panel on the day she return for echocardiogram next month.  She can follow-up with Dr. Martinique afterward.  Overall, she appears to be euvolemic.  She is aware that she can take extra dose of diuretic on the days where she is volume overloaded.  In the past 2 weeks, she has taken extra dose of diuretic twice.   Past Medical History:  Diagnosis Date  . Acute on chronic combined systolic and diastolic CHF (congestive heart failure) ([place]) 11/30/2019  . Diabetes mellitus without complication ([place])   . Dry skin    Dry skin from sjorgrens  . Hypertension   . Irregular heart beat   . NASH (nonalcoholic steatohepatitis)   . Sjogren's syndrome ([place])   . Sleep apnea   . Stroke [facility])     Past Surgical History:  Procedure Laterality Date  . ABDOMINAL HYSTERECTOMY  1989  . BACK SURGERY  2000, 2007  . CHOLECYSTECTOMY    .  COSMETIC SURGERY  1978   Face  . ESOPHAGOGASTRODUODENOSCOPY  11/11/2017   Grade 1 esophageal varices (too small for EVL). Moderate portal hypertensive gastropathy. Otherwise, normal EGD  . HAND SURGERY  1978, 1983  . INGUINAL HERNIA REPAIR Left   . KNEE SURGERY  2013  . ROTATOR CUFF REPAIR Left   . TONSILLECTOMY      Current Medications: Current Meds  Medication Sig  . carvedilol (COREG) 6.25 MG tablet Take 6.25 mg by mouth 2 (two) times daily.  . Dulaglutide (TRULICITY) 6.01 UX/3.2TF SOPN Inject  into the skin once a week.  . furosemide (LASIX) 40 MG tablet Take 2 tablets (80 mg total) by mouth every morning AND 1 tablet (40 mg total) every evening.  [name] glipiZIDE (GLUCOTROL) 5 MG tablet Take by mouth daily before breakfast.  . hydrOXYzine (ATARAX/VISTARIL) 50 MG tablet 1/2 to 1 tablet nightly as needed  . potassium chloride SA (K-DUR) 20 MEQ tablet Take 1 tablet (20 mEq total) by mouth 2 (two) times daily.  . rosuvastatin (CRESTOR) 10 MG tablet Take 1 tablet (10 mg total) by mouth daily.  [name] spironolactone (ALDACTONE) 25 MG tablet Take 0.5 tablets (12.5 mg total) by mouth daily.  . Vitamin D, Ergocalciferol, (DRISDOL) 1.25 MG (50000 UNIT) CAPS capsule Take 50,000 Units by mouth every 7 (seven) days.  . [DISCONTINUED] sacubitril-valsartan (ENTRESTO) 49-51 MG Take 1 tablet by mouth 2 (two) times daily.     Allergies:   Ceftriaxone, Adenosine, Codeine, Morphine and related, Oxycodone, Penicillins, Rocephin [ceftriaxone sodium in dextrose], and Sulfamethoxazole   Social History   Socioeconomic History  . Marital status: Married    Spouse name: Rebecca Jimenez  . Number of children: 2  . Years of education: S. College  . Highest education level: Not on file  Occupational History  . Occupation: Disabled  Tobacco Use  . Smoking status: Former Smoker    Packs/day: 0.25    Types: Cigarettes    Quit date: 05/11/2015    Years since quitting: 4.7  . Smokeless tobacco: Never Used  Substance and Sexual Activity  . Alcohol use: Yes    Comment: Consumes alcohol occasionally  . Drug use: No  . Sexual activity: Not on file  Other Topics Concern  . Not on file  Social History Narrative   Patient lives at home with her Husband Rebecca Jimenez). Patient is disabled. Patient has two children. Caffeine three cups. Left handed.    Social Determinants of Health   Financial Resource Strain:   . Difficulty of Paying Living Expenses:   Food Insecurity:   . Worried About Charity fundraiser in the Last Year:    . Arboriculturist in the Last Year:   Transportation Needs:   . Film/video editor (Medical):   [name] Lack of Transportation (Non-Medical):   Physical Activity:   . Days of Exercise per Week:   . Minutes of Exercise per Session:   Stress:   . Feeling of Stress :   Social Connections:   . Frequency of Communication with Friends and Family:   . Frequency of Social Gatherings with Friends and Family:   . Attends Religious Services:   . Active Member of Clubs or Organizations:   . Attends Archivist Meetings:   [name] Marital Status:      Family History: The patient's family history includes Cancer - Prostate in her father; Cerebrovascular Accident in her father; Diabetes in her brother, father, and mother; Heart disease in her father.  ROS:   Please see the history of present illness.     All other systems reviewed and are negative.  EKGs/Labs/Other Studies Reviewed:    The following studies were reviewed today:  N/A  EKG:  EKG is not ordered today.   Recent Labs: 01/24/2020: TSH 2.617 01/25/2020: ALT 21; B Natriuretic Peptide 85.6; BUN 17; Creatinine, Ser 1.15; Hemoglobin 10.3; Magnesium 2.0; Platelets 48; Potassium 3.9; Sodium 142  Recent Lipid Panel    Component Value Date/Time   CHOL 182 01/25/2020 0245   CHOL 186 06/01/2019 1036   TRIG 131 01/25/2020 0245   HDL 42 01/25/2020 0245   HDL 55 06/01/2019 1036   CHOLHDL 4.3 01/25/2020 0245   VLDL 26 01/25/2020 0245   LDLCALC 114 (H) 01/25/2020 0245   LDLCALC 103 (H) 06/01/2019 1036    Physical Exam:    VS:  BP 122/68   Pulse 77   Temp (!) 97 F (36.1 C)   Ht 5' 3"  (1.6 m)   Wt 272 lb (123.4 kg)   SpO2 97%   BMI 48.18 kg/m     Wt Readings from Last 3 Encounters:  02/14/20 272 lb (123.4 kg)  01/23/20 271 lb 9.7 oz (123.2 kg)  12/20/19 267 lb (121.1 kg)     GEN:  Well nourished, well developed in no acute distress HEENT: Normal NECK: No JVD; No carotid bruits LYMPHATICS: No lymphadenopathy CARDIAC:  RRR, no murmurs, rubs, gallops RESPIRATORY:  Clear to auscultation without rales, wheezing or rhonchi  ABDOMEN: Soft, non-tender, non-distended MUSCULOSKELETAL:  No edema; No deformity  SKIN: Warm and dry NEUROLOGIC:  Alert and oriented x 3 PSYCHIATRIC:  Normal affect   ASSESSMENT:    1. Atypical chest pain   2. Medication management   3. NICM (nonischemic cardiomyopathy) ([place])   4. Essential hypertension   5. Controlled type 2 diabetes mellitus without complication, without long-term current use of insulin (HCC)    PLAN:    In order of problems listed above:  1. Atypical chest pain: Her chest pain is clearly noncardiac in nature.  No further work-up  2. Nonischemic cardiomyopathy: We will further uptitrate heart failure medication by increasing Entresto to 97-103 mg twice daily.  She will need repeat basic metabolic panel in 7 days  3. Hypertension: Blood pressure stable  4. DM2: Managed by primary care provider.   Medication Adjustments/Labs and Tests Ordered: Current medicines are reviewed at length with the patient today.  Concerns regarding medicines are outlined above.  Orders Placed This Encounter  Procedures  . Basic Metabolic Panel (BMET)   Meds ordered this encounter  Medications  . sacubitril-valsartan (ENTRESTO) 97-103 MG    Sig: Take 1 tablet by mouth 2 (two) times daily.    Dispense:  60 tablet    Refill:  11    Patient Instructions  Medication Instructions:  INCREASE- Entresto 97/103 by mouth twice a day  *If you need a refill on your cardiac medications before your next appointment, please call your pharmacy*   Lab Work: BMP If you have labs (blood work) drawn today and your tests are completely normal, you will receive your results only by: [name] MyChart Message (if you have MyChart) OR . A paper copy in the mail If you have any lab test that is abnormal or we need to change your treatment, we will call you to review the  results.   Testing/Procedures: None Ordered   Follow-Up: At [facility], you and your health needs are our priority.  As part of our continuing mission to provide you with exceptional heart care, we have created designated Provider Care Teams.  These Care Teams include your primary Cardiologist (physician) and Advanced Practice Providers (APPs -  Physician Assistants and Nurse Practitioners) who all work together to provide you with the care you need, when you need it.  We recommend signing up for the patient portal called "MyChart".  Sign up information is provided on this After Visit Summary.  MyChart is used to connect with patients for Virtual Visits (Telemedicine).  Patients are able to view lab/test results, encounter notes, upcoming appointments, etc.  Non-urgent messages can be sent to your provider as well.   To learn more about what you can do with MyChart, go to [email or web address].    Your next appointment:   July with Dr Martinique  The format for your next appointment:   In Person       Hilbert Corrigan, [place]  02/16/2020 11:47 PM    [place]

## 2020-02-16 ENCOUNTER — Encounter: Payer: Self-pay | Admitting: Physician Assistant

## 2020-02-20 ENCOUNTER — Other Ambulatory Visit: Payer: Self-pay | Admitting: Transplant Hepatology

## 2020-02-20 DIAGNOSIS — K7581 Nonalcoholic steatohepatitis (NASH): Secondary | ICD-10-CM

## 2020-02-20 DIAGNOSIS — K7460 Unspecified cirrhosis of liver: Secondary | ICD-10-CM

## 2020-02-20 DIAGNOSIS — Z1289 Encounter for screening for malignant neoplasm of other sites: Secondary | ICD-10-CM

## 2020-02-27 ENCOUNTER — Other Ambulatory Visit: Payer: Self-pay

## 2020-02-27 ENCOUNTER — Ambulatory Visit (HOSPITAL_COMMUNITY): Payer: Medicare PPO | Attending: Cardiovascular Disease

## 2020-02-27 DIAGNOSIS — I5022 Chronic systolic (congestive) heart failure: Secondary | ICD-10-CM | POA: Insufficient documentation

## 2020-02-27 MED ORDER — PERFLUTREN LIPID MICROSPHERE
1.0000 mL | INTRAVENOUS | Status: AC | PRN
  Administered 2020-02-27: 1 mL via INTRAVENOUS

## 2020-02-28 LAB — BASIC METABOLIC PANEL
BUN/Creatinine Ratio: 19 (ref 12–28)
BUN: 21 mg/dL (ref 8–27)
CO2: 25 mmol/L (ref 20–29)
Calcium: 9.5 mg/dL (ref 8.7–10.3)
Chloride: 107 mmol/L — ABNORMAL HIGH (ref 96–106)
Creatinine, Ser: 1.08 mg/dL — ABNORMAL HIGH (ref 0.57–1.00)
GFR calc Af Amer: 64 mL/min/{1.73_m2} (ref >59)
GFR calc non Af Amer: 56 mL/min/{1.73_m2} — ABNORMAL LOW (ref >59)
Glucose: 171 mg/dL — ABNORMAL HIGH (ref 65–99)
Potassium: 4 mmol/L (ref 3.5–5.2)
Sodium: 146 mmol/L — ABNORMAL HIGH (ref 134–144)

## 2020-03-01 NOTE — Progress Notes (Signed)
Created in error

## 2020-03-22 ENCOUNTER — Ambulatory Visit
Admission: RE | Admit: 2020-03-22 | Discharge: 2020-03-22 | Disposition: A | Discharge status: Home / Self Care | Payer: Medicare PPO | Source: Ambulatory Visit | Attending: Transplant Hepatology | Admitting: Transplant Hepatology

## 2020-03-22 DIAGNOSIS — K7460 Unspecified cirrhosis of liver: Secondary | ICD-10-CM

## 2020-03-22 DIAGNOSIS — K7581 Nonalcoholic steatohepatitis (NASH): Secondary | ICD-10-CM

## 2020-03-22 DIAGNOSIS — Z1289 Encounter for screening for malignant neoplasm of other sites: Secondary | ICD-10-CM

## 2020-03-22 MED ORDER — GADOBENATE DIMEGLUMINE 529 MG/ML IV SOLN
20.0000 mL | Freq: Once | INTRAVENOUS | Status: AC | PRN
  Administered 2020-03-22: 20 mL via INTRAVENOUS

## 2020-03-29 NOTE — Progress Notes (Signed)
Cardiology Office Note:    Date:  04/02/2020   ID:  Army Fossa, DOB 1956-03-03, MRN [id number]  PCP:  Myrlene Broker, MD  Cardiologist:  Tnya Ades Martinique, MD  Electrophysiologist:  None   Referring MD: Myrlene Broker, MD   Chief Complaint  Patient presents with   Congestive Heart Failure    History of Present Illness:    Rebecca Jimenez is a 64 y.o. female with a hx of Sjogren's syndrome,HTN, DM 2,NASHcirrhosis [facility](confirmed on biopsy 2014), PVCs,NICM, OSA and history of CVA. She had a remote cardiac catheterization in 2009 which showed no significant CAD. Myoview in September 2017 showed EF 46%, no ischemia. Echocardiogram showed global hypokinesis with borderline LV dysfunction. She also has a chronic left bundle branch block. 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 (She would require clearance from liver specialist and thrombopoietin receptoragonist such as Avatrombopagshould she require any surgery in the future due to risk of bleeding) Patient is followed by her liver specialist at [place].  The patient was seen in September 2020 for volume overload and weight gain.  Lasix was increased to 80 mg a.m. and 40 mg p.m.  She had a significant weight loss and was breathing better afterward.  She was admitted in [facility] in early February 2021 with COVID-19 pneumonia.  Echocardiogram at the time showed EF 35 to 40%.  Chest x-ray showed diffuse pulmonary infiltrate.  When she was seen by Dr. Martinique virtually in March,  Lasix was increased to 80 mg twice daily for 3 days before going back to the 80 mg a.m. and 40 mg p.m.  Losartan was also switched to Entresto twice a day.  Aldactone was added to her medical regimen.  She was admitted in May 2021 with chest pain.  Coronary CT performed on 01/24/2020 showed coronary calcium score of 40 which placed the patient at 18  percentile for age and sex matched control, mild mixed nonobstructive CAD. On subsequent follow up Entresto dose was increased. Follow up Echo in June showed improvement in EF to 45-50%.   She reports she was on vacation last week at [facility]. Wasn't able to monitor weight. When she got back she had gained 10 lbs. Notes increased abdominal girth and LE edema. Has been compliant with meds and does most of her own cooking so watches salt. Notes more difficulty breathing and decreased energy. Had MRI abdomen recently showing cirrhosis with portal HTN, varices and small amount of ascites.      Past Medical History:  Diagnosis Date   Acute on chronic combined systolic and diastolic CHF (congestive heart failure) ([place]) 11/30/2019   Diabetes mellitus without complication (HCC)    Dry skin    Dry skin from sjorgrens   Hypertension    Irregular heart beat    NASH (nonalcoholic steatohepatitis)    Sjogren's syndrome ([place])    Sleep apnea    Stroke [facility])     Past Surgical History:  Procedure Laterality Date   [place]  2000, 2007   [place]   Face   ESOPHAGOGASTRODUODENOSCOPY  11/11/2017   Grade 1 esophageal varices (too small for EVL). Moderate portal hypertensive gastropathy. Otherwise, normal EGD   HAND SURGERY  1978, 1983   INGUINAL HERNIA REPAIR Left    KNEE SURGERY  2013   ROTATOR CUFF REPAIR Left  TONSILLECTOMY      Current Medications: Current Meds  Medication Sig   carvedilol (COREG) 6.25 MG tablet Take 6.25 mg by mouth 2 (two) times daily.   Dulaglutide (TRULICITY) 1.69 IH/0.3UU SOPN Inject into the skin once a week.   furosemide (LASIX) 40 MG tablet Take 2 tablets (80 mg total) by mouth every morning AND 1 tablet (40 mg total) every evening.   glipiZIDE (GLUCOTROL) 5 MG tablet Take by mouth daily before breakfast.   hydrOXYzine (ATARAX/VISTARIL) 50 MG tablet 1/2 to 1 tablet  nightly as needed   potassium chloride SA (K-DUR) 20 MEQ tablet Take 1 tablet (20 mEq total) by mouth 2 (two) times daily.   rosuvastatin (CRESTOR) 10 MG tablet Take 1 tablet (10 mg total) by mouth daily.   sacubitril-valsartan (ENTRESTO) 97-103 MG Take 1 tablet by mouth 2 (two) times daily.   spironolactone (ALDACTONE) 25 MG tablet Take 0.5 tablets (12.5 mg total) by mouth daily.   Vitamin D, Ergocalciferol, (DRISDOL) 1.25 MG (50000 UNIT) CAPS capsule Take 50,000 Units by mouth every 7 (seven) days.     Allergies:   Ceftriaxone, Adenosine, Codeine, Morphine and related, Oxycodone, Penicillins, Rocephin [ceftriaxone sodium in dextrose], and Sulfamethoxazole   Social History   Socioeconomic History   Marital status: Married    Spouse name: [name]   Number of children: 2   Years of education: S. College   Highest education level: Not on file  Occupational History   Occupation: Disabled  Tobacco Use   Smoking status: Former Smoker    Packs/day: 0.25    Types: Cigarettes    Quit date: 05/11/2015    Years since quitting: 4.8   Smokeless tobacco: Never Used  Vaping Use   Vaping Use: Never used  Substance and Sexual Activity   Alcohol use: Yes    Comment: Consumes alcohol occasionally   Drug use: No   Sexual activity: Not on file  Other Topics Concern   Not on file  Social History Narrative   Patient lives at home with her Husband [name]). Patient is disabled. Patient has two children. Caffeine three cups. Left handed.    Social Determinants of Health   Financial Resource Strain:    Difficulty of Paying Living Expenses:   Food Insecurity:    Worried About Charity fundraiser in the Last Year:    Arboriculturist in the Last Year:   Transportation Needs:    Film/video editor (Medical):    Lack of Transportation (Non-Medical):   Physical Activity:    Days of Exercise per Week:    Minutes of Exercise per Session:   Stress:    Feeling of Stress :    Social Connections:    Frequency of Communication with Friends and Family:    Frequency of Social Gatherings with Friends and Family:    Attends Religious Services:    Active Member of Clubs or Organizations:    Attends Music therapist:    Marital Status:      Family History: The patient's family history includes Cancer - Prostate in her father; Cerebrovascular Accident in her father; Diabetes in her brother, father, and mother; Heart disease in her father.  ROS:   Please see the history of present illness.     All other systems reviewed and are negative.  EKGs/Labs/Other Studies Reviewed:    The following studies were reviewed today:  EKG:  EKG is not ordered today.   Recent Labs: 01/24/2020: TSH 2.617  01/25/2020: ALT 21; B Natriuretic Peptide 85.6; Hemoglobin 10.3; Magnesium 2.0; Platelets 48 02/27/2020: BUN 21; Creatinine, Ser 1.08; Potassium 4.0; Sodium 146  Recent Lipid Panel    Component Value Date/Time   CHOL 182 01/25/2020 0245   CHOL 186 06/01/2019 1036   TRIG 131 01/25/2020 0245   HDL 42 01/25/2020 0245   HDL 55 06/01/2019 1036   CHOLHDL 4.3 01/25/2020 0245   VLDL 26 01/25/2020 0245   LDLCALC 114 (H) 01/25/2020 0245   LDLCALC 103 (H) 06/01/2019 1036    Echo 02/27/20: IMPRESSIONS    1. Left ventricular ejection fraction, by estimation, is 45 to 50%. The  left ventricle has mildly decreased function. The left ventricle  demonstrates regional wall motion abnormalities (see scoring  diagram/findings for description). There is mild  concentric left ventricular hypertrophy. Left ventricular diastolic  parameters are consistent with Grade I diastolic dysfunction (impaired  relaxation). Elevated left ventricular end-diastolic pressure.  2. Right ventricular systolic function is normal. The right ventricular  size is normal. There is normal pulmonary artery systolic pressure.  3. The mitral valve is normal in structure. Trivial mitral valve   regurgitation. No evidence of mitral stenosis.  4. The aortic valve is normal in structure. Aortic valve regurgitation is  not visualized. No aortic stenosis is present.  5. The inferior vena cava is normal in size with greater than 50%  respiratory variability, suggesting right atrial pressure of 3 mmHg.   Comparison(s): Care Everywhere.   Physical Exam:    VS:  BP 132/67    Pulse 82    Temp (!) 97 F (36.1 C)    Ht 5' 3"  (1.6 m)    Wt 283 lb 9.6 oz (128.6 kg)    SpO2 97%    BMI 50.24 kg/m     Wt Readings from Last 3 Encounters:  04/02/20 283 lb 9.6 oz (128.6 kg)  02/14/20 272 lb (123.4 kg)  01/23/20 271 lb 9.7 oz (123.2 kg)     GEN:  Well nourished, well developed in no acute distress HEENT: Normal NECK: No JVD; No carotid bruits LYMPHATICS: No lymphadenopathy CARDIAC: RRR, no murmurs, rubs, gallops RESPIRATORY:  Clear to auscultation without rales, wheezing or rhonchi  ABDOMEN: Soft, non-tender, non-distended. No fluid wave MUSCULOSKELETAL:  2+ edema; No deformity  SKIN: Warm and dry NEUROLOGIC:  Alert and oriented x 3 PSYCHIATRIC:  Normal affect   ASSESSMENT:    1. Chronic systolic heart failure ([place])   2. Essential hypertension   3. Liver cirrhosis secondary to NASH [facility])    PLAN:    In order of problems listed above:  1. Nonischemic cardiomyopathy: With medication titration EF improved to 45-50%. She has however increased weight be 15 lbs since March. Has some ascites on MRI. I suspect this may be more related to her cirrhosis than CHF given improvement in EF on Echo. We will increase lasix to 80 mg bid. Increase aldactone to 25 mg daily. Repeat BMET in 1-2 weeks and follow up with APP in 3 weeks. Continue Coreg and Entresto.   2. Hypertension: Blood pressure stable  3. DM2: Managed by primary care provider.  4.   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 Follow up with Dr Manuella Ghazi.   Medication Adjustments/Labs and Tests Ordered: Current medicines are  reviewed at length with the patient today.  Concerns regarding medicines are outlined above.  No orders of the defined types were placed in this encounter.  No orders of the defined types were placed  in this encounter.   Patient Instructions  Increase spironolactone to 25 mg daily  Increase lasix to 80 mg bid.   Sodium restriction  Will follow up lab work 1-2 weeks.   Follow up in 3-4 weeks.     Signed, Zaakirah Kistner Martinique, MD  04/02/2020 3:49 PM    [place]

## 2020-04-02 ENCOUNTER — Encounter: Payer: Self-pay | Admitting: Cardiology

## 2020-04-02 ENCOUNTER — Ambulatory Visit: Payer: Medicare PPO | Admitting: Cardiology

## 2020-04-02 ENCOUNTER — Other Ambulatory Visit: Payer: Self-pay

## 2020-04-02 VITALS — BP 132/67 | HR 82 | Temp 97.0°F | Ht 63.0 in | Wt 283.6 lb

## 2020-04-02 DIAGNOSIS — K7460 Unspecified cirrhosis of liver: Secondary | ICD-10-CM

## 2020-04-02 DIAGNOSIS — K7581 Nonalcoholic steatohepatitis (NASH): Secondary | ICD-10-CM

## 2020-04-02 DIAGNOSIS — I10 Essential (primary) hypertension: Secondary | ICD-10-CM

## 2020-04-02 DIAGNOSIS — I5022 Chronic systolic (congestive) heart failure: Secondary | ICD-10-CM | POA: Diagnosis not present

## 2020-04-02 MED ORDER — FUROSEMIDE 40 MG PO TABS: 80.0000 mg | ORAL_TABLET | Freq: Two times a day (BID) | ORAL | 3 refills | Status: DC

## 2020-04-02 MED ORDER — ROSUVASTATIN CALCIUM 10 MG PO TABS: 10.0000 mg | ORAL_TABLET | Freq: Every day | ORAL | 3 refills | Status: DC

## 2020-04-02 MED ORDER — SPIRONOLACTONE 25 MG PO TABS
25.0000 mg | ORAL_TABLET | Freq: Every day | ORAL | 11 refills | Status: DC
Start: 2020-04-02 — End: 2020-10-23

## 2020-04-02 NOTE — Addendum Note (Signed)
Addended by: Kathyrn Lass on: 04/02/2020 03:56 PM   Modules accepted: Orders

## 2020-04-02 NOTE — Patient Instructions (Signed)
Increase spironolactone to 25 mg daily  Increase lasix to 80 mg bid.   Sodium restriction  Will follow up lab work 1-2 weeks.   Follow up in 3-4 weeks.

## 2020-04-16 LAB — BASIC METABOLIC PANEL
BUN/Creatinine Ratio: 25 (ref 12–28)
BUN: 27 mg/dL (ref 8–27)
CO2: 26 mmol/L (ref 20–29)
Calcium: 9.3 mg/dL (ref 8.7–10.3)
Chloride: 107 mmol/L — ABNORMAL HIGH (ref 96–106)
Creatinine, Ser: 1.08 mg/dL — ABNORMAL HIGH (ref 0.57–1.00)
GFR calc Af Amer: 64 mL/min/{1.73_m2} (ref >59)
GFR calc non Af Amer: 56 mL/min/{1.73_m2} — ABNORMAL LOW (ref >59)
Glucose: 154 mg/dL — ABNORMAL HIGH (ref 65–99)
Potassium: 4 mmol/L (ref 3.5–5.2)
Sodium: 145 mmol/L — ABNORMAL HIGH (ref 134–144)

## 2020-04-22 NOTE — Progress Notes (Signed)
Cardiology Clinic Note   Patient Name: Rebecca Jimenez Date of Encounter: 04/23/2020  Primary Care Provider:  Myrlene Broker, MD Primary Cardiologist:  [name], MD  Patient Profile    Rebecca Jimenez 64 year old female presents to the clinic today for follow-up evaluation of her CHF.  Past Medical History    Past Medical History:  Diagnosis Date   Acute on chronic combined systolic and diastolic CHF (congestive heart failure) ([place]) 11/30/2019   Diabetes mellitus without complication (HCC)    Dry skin    Dry skin from sjorgrens   Hypertension    Irregular heart beat    NASH (nonalcoholic steatohepatitis)    Sjogren's syndrome ([place])    Sleep apnea    Stroke [facility])    Past Surgical History:  Procedure Laterality Date   [place]  2000, 2007   [place]   Face   ESOPHAGOGASTRODUODENOSCOPY  11/11/2017   Grade 1 esophageal varices (too small for EVL). Moderate portal hypertensive gastropathy. Otherwise, normal EGD   HAND SURGERY  1978, 1983   INGUINAL HERNIA REPAIR Left    KNEE SURGERY  2013   ROTATOR CUFF REPAIR Left    TONSILLECTOMY      Allergies  Allergies  Allergen Reactions   Ceftriaxone Shortness Of Breath    Throat swelling   Adenosine     Rapid heart rate   Codeine Rash   Morphine And Related     Unknown reaction   Oxycodone Nausea And Vomiting   Penicillins     Unknown reaction   Rocephin [Ceftriaxone Sodium In Dextrose]     Swollen throat   Sulfamethoxazole Nausea And Vomiting    History of Present Illness    Ms. Bolser has a PMH of Sjogren's syndrome, hypertension, diabetes type 2, NASH cirrhosis followed by Dr. Manuella Ghazi at [facility] liver center (confirmed by biopsy 2014), PVCs, nonischemic cardiomyopathy, obstructive sleep apnea and history of CVA.  She underwent cardiac catheterization in 2009 which showed no significant CAD.  Nuclear stress test 9/17  showed EF 46% and no ischemia.  Echocardiogram showed global hypokinesis with borderline LV dysfunction.  She has a known chronic left bundle branch block.  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  (She requires clearance from liver specialist and thrombin protein receptor agonist such as Avatrombopag in the event surgery is required due to her risk of bleeding.)  She was seen and evaluated 9/20 for fluid volume overload and weight gain.  Her Lasix was increased to 80 mg a.m. and 40 mg p.m.  She had significant weight loss and improvement in her breathing.  She was admitted to [facility] 2/21 with COVID-19 pneumonia.  Her echocardiogram showed an EF of 35-40%.  CXR showed diffuse pulmonary infiltrates.  She was seen by Dr. Martinique virtually March 21.  Her Lasix was increased to 80 mg twice daily for 3 days before going back to 80 mg a.m. and 40 mg p.m. her losartan was switched to [place] twice daily.  Aldactone was added to her medication regimen.  She was admitted 5/21 with chest pain.  Coronary CT performed 01/24/2020 showed coronary calcium score of 40 placing her on 80th percentile for age and sex matched control.  Evidence of mild mixed nonobstructive CAD.  During her subsequent follow-up her Entresto dose was increased.  A follow-up echocardiogram 6/21 showed an improvement in her LVEF  to 45-50%.  She was last seen by Dr. Martinique on 04/02/2020.  She indicated that she had been on vacation at [facility] prior week.  She was not able to monitor her weight at that time.  She had gained 10 pounds.  She noted increased abdominal girth and bilateral lower extremity edema.  She indicated that she had been compliant with her medications, was doing her own food preparation and staying away from sodium.  She felt that her breathing was more difficult and she had decreased energy.  A recent MRI of her abdomen showed cirrhosis with portal hypertension, varices  and small amount of ascites.  Her Lasix was increased to 80 mg twice daily, Aldactone increased to 25 mg daily, and her BMP was stable ( 04/16/2020).  She presents to the clinic today for follow-up evaluation and states her weight has continued to decrease.  She is down to 279 pounds today.  She has much less lower extremity edema and states her shoes are fitting much better.  She has noticed that her heart rate has been lower in the 40s at times, today it is 55 bpm.  I will decrease her carvedilol to 3.125.  We will keep her furosemide at 80 mg twice daily Monday Wednesday Friday Sunday.  We will keep her furosemide at 80 mg - 40 mg p.m. Tuesday Thursday Saturday.  I have encouraged her to maintain her fluid restriction and wear lower extremity support stockings.  BMP was stable on repeat.  I will have her follow-up in 1 month.  Today she denies chest pain, shortness of breath, lower extremity edema, fatigue, palpitations, melena, hematuria, hemoptysis, diaphoresis, weakness, presyncope, syncope, orthopnea, and PND.   Home Medications    Prior to Admission medications   Medication Sig Start Date End Date Taking? Authorizing Provider  carvedilol (COREG) 6.25 MG tablet Take 6.25 mg by mouth 2 (two) times daily. 02/14/20   [provider]  Dulaglutide (TRULICITY) 1.74 YC/1.4GY SOPN Inject into the skin once a week.    [provider]  furosemide (LASIX) 40 MG tablet Take 2 tablets (80 mg total) by mouth 2 (two) times daily. 04/02/20   Martinique, Peter M, MD  glipiZIDE (GLUCOTROL) 5 MG tablet Take by mouth daily before breakfast.    [provider]  hydrOXYzine (ATARAX/VISTARIL) 50 MG tablet 1/2 to 1 tablet nightly as needed 02/02/20   [provider]  potassium chloride SA (K-DUR) 20 MEQ tablet Take 1 tablet (20 mEq total) by mouth 2 (two) times daily. 05/25/19   Almyra Deforest, PA  rosuvastatin (CRESTOR) 10 MG tablet Take 1 tablet (10 mg total) by mouth daily. 04/02/20   Martinique,  Peter M, MD  sacubitril-valsartan (ENTRESTO) 97-103 MG Take 1 tablet by mouth 2 (two) times daily. 02/14/20   Almyra Deforest, PA  spironolactone (ALDACTONE) 25 MG tablet Take 1 tablet (25 mg total) by mouth daily. 04/02/20 03/28/21  Martinique, Peter M, MD  Vitamin D, Ergocalciferol, (DRISDOL) 1.25 MG (50000 UNIT) CAPS capsule Take 50,000 Units by mouth every 7 (seven) days.    [provider]    Family History    Family History  Problem Relation Age of Onset   Diabetes Mother    Cerebrovascular Accident Father    Diabetes Father    Cancer - Prostate Father    Heart disease Father    Diabetes Brother    She indicated that her mother is alive. She indicated that her father is deceased. She indicated that  her brother is alive. She indicated that her maternal grandmother is deceased. She indicated that her maternal grandfather is deceased. She indicated that her paternal grandmother is deceased. She indicated that her paternal grandfather is deceased.  Social History    Social History   Socioeconomic History   Marital status: Married    Spouse name: [name]   Number of children: 2   Years of education: [employer]   Highest education level: Not on file  Occupational History   Occupation: Disabled  Tobacco Use   Smoking status: Former Smoker    Packs/day: 0.25    Types: Cigarettes    Quit date: 05/11/2015    Years since quitting: 4.9   Smokeless tobacco: Never Used  Vaping Use   Vaping Use: Never used  Substance and Sexual Activity   Alcohol use: Yes    Comment: Consumes alcohol occasionally   Drug use: No   Sexual activity: Not on file  Other Topics Concern   Not on file  Social History Narrative   Patient lives at home with her Husband [name]). Patient is disabled. Patient has two children. Caffeine three cups. Left handed.    Social Determinants of Health   Financial Resource Strain:    Difficulty of Paying Living Expenses:   Food Insecurity:     Worried About Charity fundraiser in the Last Year:    Arboriculturist in the Last Year:   Transportation Needs:    Film/video editor (Medical):    Lack of Transportation (Non-Medical):   Physical Activity:    Days of Exercise per Week:    Minutes of Exercise per Session:   Stress:    Feeling of Stress :   Social Connections:    Frequency of Communication with Friends and Family:    Frequency of Social Gatherings with Friends and Family:    Attends Religious Services:    Active Member of Clubs or Organizations:    Attends Music therapist:    Marital Status:   Intimate Partner Violence:    Fear of Current or Ex-Partner:    Emotionally Abused:    Physically Abused:    Sexually Abused:      Review of Systems    General:  No chills, fever, night sweats or weight changes.  Cardiovascular:  No chest pain, dyspnea on exertion, edema, orthopnea, palpitations, paroxysmal nocturnal dyspnea. Dermatological: No rash, lesions/masses Respiratory: No cough, dyspnea Urologic: No hematuria, dysuria Abdominal:   No nausea, vomiting, diarrhea, bright red blood per rectum, melena, or hematemesis Neurologic:  No visual changes, wkns, changes in mental status. All other systems reviewed and are otherwise negative except as noted above.  Physical Exam    VS:  BP 140/60    Pulse (!) 55    Temp (!) 97.2 F (36.2 C)    Ht 5' 3"  (1.6 m)    Wt 279 lb (126.6 kg)    SpO2 99%    BMI 49.42 kg/m  , BMI Body mass index is 49.42 kg/m. GEN: Well nourished, well developed, in no acute distress. HEENT: normal. Neck: Supple, no JVD, carotid bruits, or masses. Cardiac: RRR, no murmurs, rubs, or gallops. No clubbing, cyanosis, edema.  Radials/DP/PT 2+ and equal bilaterally.  Respiratory:  Respirations regular and unlabored, clear to auscultation bilaterally. GI: Soft, nontender, nondistended, BS + x 4. MS: no deformity or atrophy. Skin: warm and dry, no rash. Neuro:   Strength and sensation are intact. Psych: Normal affect.  Accessory Clinical Findings    Recent Labs: 01/24/2020: TSH 2.617 01/25/2020: ALT 21; B Natriuretic Peptide 85.6; Hemoglobin 10.3; Magnesium 2.0; Platelets 48 04/16/2020: BUN 27; Creatinine, Ser 1.08; Potassium 4.0; Sodium 145   Recent Lipid Panel    Component Value Date/Time   CHOL 182 01/25/2020 0245   CHOL 186 06/01/2019 1036   TRIG 131 01/25/2020 0245   HDL 42 01/25/2020 0245   HDL 55 06/01/2019 1036   CHOLHDL 4.3 01/25/2020 0245   VLDL 26 01/25/2020 0245   LDLCALC 114 (H) 01/25/2020 0245   LDLCALC 103 (H) 06/01/2019 1036    ECG personally reviewed by me today-none today.  Echocardiogram 02/27/2020 IMPRESSIONS    1. Left ventricular ejection fraction, by estimation, is 45 to 50%. The  left ventricle has mildly decreased function. The left ventricle  demonstrates regional wall motion abnormalities (see scoring  diagram/findings for description). There is mild  concentric left ventricular hypertrophy. Left ventricular diastolic  parameters are consistent with Grade I diastolic dysfunction (impaired  relaxation). Elevated left ventricular end-diastolic pressure.  2. Right ventricular systolic function is normal. The right ventricular  size is normal. There is normal pulmonary artery systolic pressure.  3. The mitral valve is normal in structure. Trivial mitral valve  regurgitation. No evidence of mitral stenosis.  4. The aortic valve is normal in structure. Aortic valve regurgitation is  not visualized. No aortic stenosis is present.  5. The inferior vena cava is normal in size with greater than 50%  respiratory variability, suggesting right atrial pressure of 3 mmHg.   Assessment & Plan   1.  Nonischemic cardiomyopathy-no increased work of breathing today or activity intolerance.  Echocardiogram 02/27/2020 showed LVEF of 45-50%, and G1 DD.  Follow-up BMP stable. Continue furosemide 80 mg twice daily Monday  Wednesday Friday Sunday Continue furosemide 80 mg a.m. and 40 mg p.m. Tuesday Thursday Saturday Continue carvedilol, Entresto, spironolactone Daily weights-May take an extra dose of furosemide with a weight increase of 3 pounds overnight or 5 pounds in 1 week. Fluid restriction-64 ounces daily Lower extremity support stockings Heart healthy low-sodium diet-salty 6 given Increase physical activity as tolerated  Bradycardia-heart rate today 55 bpm.  Has been monitoring heart rate with apple watch and indicates that she has seen lower heart rates in the 40s at times. Decrease carvedilol to 3.125 mg twice daily Heart healthy low-sodium diet Increase physical activity as tolerated  Essential hypertension-BP today 140/60.  Well-controlled at home. Continue Entresto, spironolactone Decrease carvedilol to 3.125 Heart healthy low-sodium diet-salty 6 given Increase physical activity as tolerated Blood pressure log  Hepatic cirrhosis with portal hypertension-monitor/followed by Dr. Arbutus Leas liver specialist  Type 2 diabetes-A1c 6.0 on 01/25/2020 Continue glipizide, Trulicity Followed by PCP   Disposition: Follow-up with Dr. Martinique or me in 1 month.  Jossie Ng. Margareth Kanner NP-C    04/23/2020, 3:43 PM [place] [address] Office [phone] Fax [phone]  Notice: This dictation was prepared with Dragon dictation along with smaller phrase technology. Any transcriptional errors that result from this process are unintentional and may not be corrected upon review.

## 2020-04-23 ENCOUNTER — Other Ambulatory Visit: Payer: Self-pay

## 2020-04-23 ENCOUNTER — Ambulatory Visit: Payer: Medicare PPO | Admitting: General Practice

## 2020-04-23 ENCOUNTER — Encounter: Payer: Self-pay | Admitting: General Practice

## 2020-04-23 VITALS — BP 140/60 | HR 55 | Temp 97.2°F | Ht 63.0 in | Wt 279.0 lb

## 2020-04-23 DIAGNOSIS — K7581 Nonalcoholic steatohepatitis (NASH): Secondary | ICD-10-CM | POA: Diagnosis not present

## 2020-04-23 DIAGNOSIS — I428 Other cardiomyopathies: Secondary | ICD-10-CM | POA: Diagnosis not present

## 2020-04-23 DIAGNOSIS — I10 Essential (primary) hypertension: Secondary | ICD-10-CM | POA: Diagnosis not present

## 2020-04-23 DIAGNOSIS — E119 Type 2 diabetes mellitus without complications: Secondary | ICD-10-CM

## 2020-04-23 DIAGNOSIS — K7460 Unspecified cirrhosis of liver: Secondary | ICD-10-CM

## 2020-04-23 MED ORDER — FUROSEMIDE 80 MG PO TABS: ORAL_TABLET | ORAL | 3 refills | Status: DC

## 2020-04-23 MED ORDER — CARVEDILOL 3.125 MG PO TABS: 3.1250 mg | ORAL_TABLET | Freq: Two times a day (BID) | ORAL | 3 refills | Status: DC

## 2020-04-23 NOTE — Patient Instructions (Signed)
Medication Instructions:  Decrease Carvedilol to 3.125 mg twice a day Take Lasix 80 mg twice a day on Monday, Wednesday, Friday, and Sunday Then take 80 mg in the morning and 40 mg at night on Tuesday, Thurs, and Saturday    *If you need a refill on your cardiac medications before your next appointment, please call your pharmacy*   Lab Work: None   Testing/Procedures: None   Follow-Up: At [facility], you and your health needs are our priority.  As part of our continuing mission to provide you with exceptional heart care, we have created designated Provider Care Teams.  These Care Teams include your primary Cardiologist (physician) and Advanced Practice Providers (APPs -  Physician Assistants and Nurse Practitioners) who all work together to provide you with the care you need, when you need it.  We recommend signing up for the patient portal called "MyChart".  Sign up information is provided on this After Visit Summary.  MyChart is used to connect with patients for Virtual Visits (Telemedicine).  Patients are able to view lab/test results, encounter notes, upcoming appointments, etc.  Non-urgent messages can be sent to your provider as well.   To learn more about what you can do with MyChart, go to [email or web address].    Your next appointment:   1 month(s)  The format for your next appointment:   In Person  Provider:   Coletta Memos, NP   Other Instructions Restrict fluid to 64 oz daily Please keep a log of your blood pressure and heart rate daily

## 2020-05-20 ENCOUNTER — Ambulatory Visit: Admit: 2020-05-20 | Payer: MEDICARE

## 2020-06-03 ENCOUNTER — Other Ambulatory Visit: Payer: Self-pay

## 2020-06-03 ENCOUNTER — Ambulatory Visit (INDEPENDENT_AMBULATORY_CARE_PROVIDER_SITE_OTHER): Payer: Medicare PPO | Admitting: General Practice

## 2020-06-03 ENCOUNTER — Encounter: Payer: Self-pay | Admitting: General Practice

## 2020-06-03 VITALS — BP 124/56 | HR 82 | Ht 63.0 in | Wt 288.0 lb

## 2020-06-03 DIAGNOSIS — Z79899 Other long term (current) drug therapy: Secondary | ICD-10-CM

## 2020-06-03 DIAGNOSIS — I428 Other cardiomyopathies: Secondary | ICD-10-CM | POA: Diagnosis not present

## 2020-06-03 DIAGNOSIS — I10 Essential (primary) hypertension: Secondary | ICD-10-CM

## 2020-06-03 DIAGNOSIS — K7460 Unspecified cirrhosis of liver: Secondary | ICD-10-CM

## 2020-06-03 DIAGNOSIS — R001 Bradycardia, unspecified: Secondary | ICD-10-CM | POA: Diagnosis not present

## 2020-06-03 DIAGNOSIS — K7581 Nonalcoholic steatohepatitis (NASH): Secondary | ICD-10-CM

## 2020-06-03 DIAGNOSIS — R002 Palpitations: Secondary | ICD-10-CM

## 2020-06-03 DIAGNOSIS — K922 Gastrointestinal hemorrhage, unspecified: Secondary | ICD-10-CM

## 2020-06-03 DIAGNOSIS — E119 Type 2 diabetes mellitus without complications: Secondary | ICD-10-CM

## 2020-06-03 NOTE — Patient Instructions (Addendum)
Medication Instructions:  STOP CARVIDELOL *If you need a refill on your cardiac medications before your next appointment, please call your pharmacy*  Lab Work: BMET AND CBC TODAY If you have labs (blood work) drawn today and your tests are completely normal, you will receive your results only by:  [place] (if you have MyChart) OR A paper copy in the mail.  If you have any lab test that is abnormal or we need to change your treatment, we will call you to review the results. You may go to any [facility] that is convenient for you however, we do have a lab in our office that is able to assist you. You DO NOT need an appointment for our lab. The lab is open 8:00am and closes at 4:00pm. Lunch 12:45 - 1:45pm.  Testing/Procedures: 7 DAY ZIO MONITOR-SEE ATTACHED FOR FURTHER INSTRUCTIONS  Special Instructions FOLLOW UP WITH DR Jilda Roche [phone]  PLEASE READ AND FOLLOW SALTY 6-ATTACHED  Follow-Up: Your next appointment:  6-8 week(s)   In Person with [name], MD -[place], FNP-C  At [facility], you and your health needs are our priority.  As part of our continuing mission to provide you with exceptional heart care, we have created designated Provider Care Teams.  These Care Teams include your primary Cardiologist (physician) and Advanced Practice Providers (APPs -  Physician Assistants and Nurse Practitioners) who all work together to provide you with the care you need, when you need it.   Your physician has recommended that you wear a 7 DAY ZIO-PATCH monitor. The Zio patch cardiac monitor continuously records heart rhythm data for up to 14 days, this is for patients being evaluated for multiple types heart rhythms. For the first 24 hours post application, please avoid getting the Zio monitor wet in the shower or by excessive sweating during exercise. After that, feel free to carry on with regular activities. Keep soaps and lotions away from the ZIO XT Patch.  This will be  mailed to you, please expect 7-10 days to receive.    Applying the monitor   Shave hair from upper left chest.   Hold abrader disc by orange tab.  Rub abrader in 40 strokes over left upper chest as indicated in your monitor instructions.   Clean area with 4 enclosed alcohol pads .  Use all pads to assure are is cleaned thoroughly.  Let dry.   Apply patch as indicated in monitor instructions.  Patch will be place under collarbone on left side of chest with arrow pointing upward.   Rub patch adhesive wings for 2 minutes.Remove white label marked "1".  Remove white label marked "2".  Rub patch adhesive wings for 2 additional minutes.   While looking in a mirror, press and release button in center of patch.  A small green light will flash 3-4 times .  This will be your only indicator the monitor has been turned on.     Do not shower for the first 24 hours.  You may shower after the first 24 hours.   Press button if you feel a symptom. You will hear a small click.  Record Date, Time and Symptom in the Patient Log Book.   When you are ready to remove patch, follow instructions on last 2 pages of Patient Log Book.  Stick patch monitor onto last page of Patient Log Book.   Place Patient Log Book in [place] box.  Use locking tab on box and tape box closed securely.  The [employer] has [employer] on it.  Please place in mailbox as soon as possible.  Your physician should have your test results approximately 7 days after the monitor has been mailed back to [facility].   Call [facility] at [phone] if you have questions regarding your ZIO XT patch monitor.  Call them immediately if you see an orange light blinking on your monitor.   If your monitor falls off in less than 4 days contact our Monitor department at [phone].  If your monitor becomes loose or falls off after 4 days call [employer] at [phone] for suggestions on securing your  monitor

## 2020-06-03 NOTE — Progress Notes (Signed)
Cardiology Clinic Note   Patient Name: Rebecca Jimenez Date of Encounter: 06/03/2020  Primary Care Provider:  Myrlene Broker, MD Primary Cardiologist:  [name], MD  Patient Profile    Rebecca Jimenez 64 year old female presents to the clinic today for follow-up evaluation of her CHF.  Past Medical History    Past Medical History:  Diagnosis Date  . Acute on chronic combined systolic and diastolic CHF (congestive heart failure) ([place]) 11/30/2019  . Diabetes mellitus without complication ([place])   . Dry skin    Dry skin from sjorgrens  . Hypertension   . Irregular heart beat   . NASH (nonalcoholic steatohepatitis)   . Sjogren's syndrome ([place])   . Sleep apnea   . Stroke [facility])    Past Surgical History:  Procedure Laterality Date  . ABDOMINAL HYSTERECTOMY  1989  . BACK SURGERY  2000, 2007  . CHOLECYSTECTOMY    . COSMETIC SURGERY  1978   Face  . ESOPHAGOGASTRODUODENOSCOPY  11/11/2017   Grade 1 esophageal varices (too small for EVL). Moderate portal hypertensive gastropathy. Otherwise, normal EGD  . HAND SURGERY  1978, 1983  . INGUINAL HERNIA REPAIR Left   . KNEE SURGERY  2013  . ROTATOR CUFF REPAIR Left   . TONSILLECTOMY      Allergies  Allergies  Allergen Reactions  . Ceftriaxone Shortness Of Breath    Throat swelling  . Adenosine     Rapid heart rate  . Codeine Rash  . Morphine And Related     Unknown reaction  . Oxycodone Nausea And Vomiting  . Penicillins     Unknown reaction  . Rocephin [Ceftriaxone Sodium In Dextrose]     Swollen throat  . Sulfamethoxazole Nausea And Vomiting    History of Present Illness    Rebecca Jimenez has a PMH of Sjogren's syndrome, hypertension, diabetes type 2, NASH cirrhosis followed by Dr. Manuella Ghazi at [facility] liver center (confirmed by biopsy 2014), PVCs, nonischemic cardiomyopathy, obstructive sleep apnea and history of CVA.  She underwent cardiac catheterization in 2009 which showed no significant CAD.  Nuclear stress test 9/17  showed EF 46% and no ischemia.  Echocardiogram showed global hypokinesis with borderline LV dysfunction.  She has a known chronic left bundle branch block.  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  (She requires clearance from liver specialist and thrombin protein receptor agonist such as Avatrombopag in the event surgery is required due to her risk of bleeding.)  She was seen and evaluated 9/20 for fluid volume overload and weight gain.  Her Lasix was increased to 80 mg a.m. and 40 mg p.m.  She had significant weight loss and improvement in her breathing.  She was admitted to [facility] 2/21 with COVID-19 pneumonia.  Her echocardiogram showed an EF of 35-40%.  CXR showed diffuse pulmonary infiltrates.  She was seen by Dr. Martinique virtually March 21.  Her Lasix was increased to 80 mg twice daily for 3 days before going back to 80 mg a.m. and 40 mg p.m. her losartan was switched to [employer] twice daily.  Aldactone was added to her medication regimen.  She was admitted 5/21 with chest pain.  Coronary CT performed 01/24/2020 showed coronary calcium score of 40 placing her on 80th percentile for age and sex matched control.  Evidence of mild mixed nonobstructive CAD.  During her subsequent follow-up her Entresto dose was increased.  A follow-up echocardiogram 6/21 showed an improvement in her LVEF  to 45-50%.  She was last seen by Dr. Martinique on 04/02/2020.  She indicated that she had been on vacation at [facility] prior week.  She was not able to monitor her weight at that time.  She had gained 10 pounds.  She noted increased abdominal girth and bilateral lower extremity edema.  She indicated that she had been compliant with her medications, was doing her own food preparation and staying away from sodium.  She felt that her breathing was more difficult and she had decreased energy.  A recent MRI of her abdomen showed cirrhosis with portal hypertension, varices  and small amount of ascites.  Her Lasix was increased to 80 mg twice daily, Aldactone increased to 25 mg daily, and her BMP was stable ( 04/16/2020).  She presented to the clinic 04/23/20 for follow-up evaluation and stated her weight had continued to decrease.  She was down to 279 pounds .  She had much less lower extremity edema and stated her shoes were fitting much better.  She had noticed that her heart rate had been lower in the 40s at times,  it is 55 bpm during her visit.  I  decreased her carvedilol to 3.125.  We will keep her furosemide at 80 mg twice daily Monday Wednesday Friday Sunday.  We will keep her furosemide at 80 mg - 40 mg p.m. Tuesday Thursday Saturday.  I  encouraged her to maintain her fluid restriction and wear lower extremity support stockings.  BMP was stable on repeat.  I planned follow-up in 1 month.  She presents to the clinic today for follow-up evaluation and states she has increased fatigue.  Her heart rates have been staying in the 40s even with a reduced dose of her carvedilol.  She has also been noticing dark stools and had a nosebleed several days ago.  She previously saw Dr. Lyndel Safe for her GI health however, he left [place] and she has not reestablish care.  She wishes to follow-up with him we discussed possibly needing a follow-up EGD/colonoscopy.  She also indicates that she has been noticing more frequent palpitations and had some dizziness.  I will order echo 7 days ear monitor, order CBC, BMP, give the [place] support stockings sheet encourage a low-sodium diet.  We will also stop her carvedilol and have her return to the clinic in 6 to 8 weeks for follow-up.  Today she denies chest pain, shortness of breath, lower extremity edema, fatigue, palpitations, melena, hematuria, hemoptysis, diaphoresis, weakness, presyncope, syncope, orthopnea, and PND.   Home Medications    Prior to Admission medications   Medication Sig Start Date End Date Taking? Authorizing  Provider  carvedilol (COREG) 3.125 MG tablet Take 1 tablet (3.125 mg total) by mouth 2 (two) times daily. 04/23/20   Deberah Pelton, NP  Dulaglutide (TRULICITY) 1.54 MG/8.6PY SOPN Inject into the skin once a week.    [provider]  furosemide (LASIX) 80 MG tablet Take 80 mg twice a day on Mon, Wed, Fri, and Sun.Then take 80 mg in the morning and 40 mg (1/2 tablet) evening on Tues, Thurs, and Sat 04/23/20   Jamian Andujo, Jossie Ng, NP  glipiZIDE (GLUCOTROL) 5 MG tablet Take by mouth daily before breakfast.    [provider]  hydrOXYzine (ATARAX/VISTARIL) 50 MG tablet 1/2 to 1 tablet nightly as needed 02/02/20   [provider]  potassium chloride SA (K-DUR) 20 MEQ tablet Take 1 tablet (20 mEq total) by mouth 2 (two) times daily. 05/25/19  Almyra Deforest, PA  rosuvastatin (CRESTOR) 10 MG tablet Take 1 tablet (10 mg total) by mouth daily. 04/02/20   Martinique, Peter M, MD  sacubitril-valsartan (ENTRESTO) 97-103 MG Take 1 tablet by mouth 2 (two) times daily. 02/14/20   Almyra Deforest, PA  spironolactone (ALDACTONE) 25 MG tablet Take 1 tablet (25 mg total) by mouth daily. 04/02/20 03/28/21  Martinique, Peter M, MD  Vitamin D, Ergocalciferol, (DRISDOL) 1.25 MG (50000 UNIT) CAPS capsule Take 50,000 Units by mouth every 7 (seven) days.    [provider]    Family History    Family History  Problem Relation Age of Onset  . Diabetes Mother   . Cerebrovascular Accident Father   . Diabetes Father   . Cancer - Prostate Father   . Heart disease Father   . Diabetes Brother    She indicated that her mother is alive. She indicated that her father is deceased. She indicated that her brother is alive. She indicated that her maternal grandmother is deceased. She indicated that her maternal grandfather is deceased. She indicated that her paternal grandmother is deceased. She indicated that her paternal grandfather is deceased.  Social History    Social History   Socioeconomic History  . Marital  status: Married    Spouse name: [name]  . Number of children: 2  . Years of education: [employer] College  . Highest education level: Not on file  Occupational History  . Occupation: Disabled  Tobacco Use  . Smoking status: Former Smoker    Packs/day: 0.25    Types: Cigarettes    Quit date: 05/11/2015    Years since quitting: 5.0  . Smokeless tobacco: Never Used  Vaping Use  . Vaping Use: Never used  Substance and Sexual Activity  . Alcohol use: Yes    Comment: Consumes alcohol occasionally  . Drug use: No  . Sexual activity: Not on file  Other Topics Concern  . Not on file  Social History Narrative   Patient lives at home with her Husband [name]). Patient is disabled. Patient has two children. Caffeine three cups. Left handed.    Social Determinants of Health   Financial Resource Strain:   . Difficulty of Paying Living Expenses: Not on file  Food Insecurity:   . Worried About Charity fundraiser in the Last Year: Not on file  . Ran Out of Food in the Last Year: Not on file  Transportation Needs:   . Lack of Transportation (Medical): Not on file  . Lack of Transportation (Non-Medical): Not on file  Physical Activity:   . Days of Exercise per Week: Not on file  . Minutes of Exercise per Session: Not on file  Stress:   . Feeling of Stress : Not on file  Social Connections:   . Frequency of Communication with Friends and Family: Not on file  . Frequency of Social Gatherings with Friends and Family: Not on file  . Attends Religious Services: Not on file  . Active Member of Clubs or Organizations: Not on file  . Attends Archivist Meetings: Not on file  . Marital Status: Not on file  Intimate Partner Violence:   . Fear of Current or Ex-Partner: Not on file  . Emotionally Abused: Not on file  . Physically Abused: Not on file  . Sexually Abused: Not on file     Review of Systems    General:  No chills, fever, night sweats or weight changes.  Cardiovascular:  No  chest pain, dyspnea on exertion, edema, orthopnea, palpitations, paroxysmal nocturnal dyspnea. Dermatological: No rash, lesions/masses Respiratory: No cough, dyspnea Urologic: No hematuria, dysuria Abdominal:   No nausea, vomiting, diarrhea, bright red blood per rectum, melena, or hematemesis Neurologic:  No visual changes, wkns, changes in mental status. All other systems reviewed and are otherwise negative except as noted above.  Physical Exam    VS:  BP (!) 124/56   Pulse 82   Ht 5' 3"  (1.6 m)   Wt 288 lb (130.6 kg)   SpO2 99%   BMI 51.02 kg/m  , BMI Body mass index is 51.02 kg/m. GEN: Well nourished, well developed, in no acute distress. HEENT: normal. Neck: Supple, no JVD, carotid bruits, or masses. Cardiac: RRR, no murmurs, rubs, or gallops. No clubbing, cyanosis, +1 pitting bilateral lower extremity edema.  Radials/DP/PT 2+ and equal bilaterally.  Respiratory:  Respirations regular and unlabored, clear to auscultation bilaterally. GI: Soft, nontender, nondistended, BS + x 4. MS: no deformity or atrophy. Skin: warm and dry, no rash. Neuro:  Strength and sensation are intact. Psych: Normal affect.  Accessory Clinical Findings    Recent Labs: 01/24/2020: TSH 2.617 01/25/2020: ALT 21; B Natriuretic Peptide 85.6; Hemoglobin 10.3; Magnesium 2.0; Platelets 48 04/16/2020: BUN 27; Creatinine, Ser 1.08; Potassium 4.0; Sodium 145   Recent Lipid Panel    Component Value Date/Time   CHOL 182 01/25/2020 0245   CHOL 186 06/01/2019 1036   TRIG 131 01/25/2020 0245   HDL 42 01/25/2020 0245   HDL 55 06/01/2019 1036   CHOLHDL 4.3 01/25/2020 0245   VLDL 26 01/25/2020 0245   LDLCALC 114 (H) 01/25/2020 0245   LDLCALC 103 (H) 06/01/2019 1036    ECG personally reviewed by me today-normal sinus rhythm left axis deviation with murmur block 70 bpm- No acute changes  Echocardiogram 02/27/2020 IMPRESSIONS    1. Left ventricular ejection fraction, by estimation, is 45 to 50%. The  left  ventricle has mildly decreased function. The left ventricle  demonstrates regional wall motion abnormalities (see scoring  diagram/findings for description). There is mild  concentric left ventricular hypertrophy. Left ventricular diastolic  parameters are consistent with Grade I diastolic dysfunction (impaired  relaxation). Elevated left ventricular end-diastolic pressure.  2. Right ventricular systolic function is normal. The right ventricular  size is normal. There is normal pulmonary artery systolic pressure.  3. The mitral valve is normal in structure. Trivial mitral valve  regurgitation. No evidence of mitral stenosis.  4. The aortic valve is normal in structure. Aortic valve regurgitation is  not visualized. No aortic stenosis is present.  5. The inferior vena cava is normal in size with greater than 50%  respiratory variability, suggesting right atrial pressure of 3 mmHg.   Assessment & Plan   1. Bradycardia/patient's -heart rate today 70 bpm.  Has been monitoring heart rate with apple watch and indicates that she has seen lower heart rates in the 40s at times.  Has noticed intermittent episodes of palpitations daily and last for seconds and then dissipate with rest. Continue carvedilol to 3.125 mg twice daily Heart healthy low-sodium diet Increase physical activity as tolerated  Order 7-day ZIO monitor Ordered CBC/BMP  Nonischemic cardiomyopathy-no increased work of breathing today or activity intolerance.  Echocardiogram 02/27/2020 showed LVEF of 45-50%, and G1 DD.  Follow-up BMP stable. Continue furosemide 80 mg twice daily Monday Wednesday Friday Sunday Continue furosemide 80 mg a.m. and 40 mg p.m. Tuesday Thursday Saturday Continue carvedilol, Entresto, spironolactone Daily weights-May take an  extra dose of furosemide with a weight increase of 3 pounds overnight or 5 pounds in 1 week. Fluid restriction-64 ounces daily Lower extremity support stockings-[place] support  stockings sheet reviewed and given. Heart healthy low-sodium diet-salty 6 given Increase physical activity as tolerated   Essential hypertension-BP today  124/56.  Well-controlled at home. Continue Entresto, spironolactone Decrease carvedilol to 3.125 Heart healthy low-sodium diet-salty 6 given Increase physical activity as tolerated Maintain blood pressure log  Hepatic cirrhosis with portal hypertension-monitor/followed by Dr. Arbutus Leas liver specialist  Type 2 diabetes-A1c 6.0 on 01/25/2020 Continue glipizide, Trulicity Followed by PCP  Possible GI bleed-has noticed recent dark stools. Order CBC Consult GI-Dr. Lyndel Safe  Disposition: Follow-up with Dr. Martinique or me in in 6 weeks.   Jossie Ng. Jurni Cesaro NP-C    06/03/2020, 3:59 PM [facility] [address] [address] Office [phone] Fax [phone]  Notice: This dictation was prepared with Dragon dictation along with smaller phrase technology. Any transcriptional errors that result from this process are unintentional and may not be corrected upon review.

## 2020-06-04 ENCOUNTER — Encounter: Payer: Self-pay | Admitting: *Deleted

## 2020-06-04 LAB — BASIC METABOLIC PANEL
BUN/Creatinine Ratio: 20 (ref 12–28)
BUN: 19 mg/dL (ref 8–27)
CO2: 25 mmol/L (ref 20–29)
Calcium: 9.3 mg/dL (ref 8.7–10.3)
Chloride: 109 mmol/L — ABNORMAL HIGH (ref 96–106)
Creatinine, Ser: 0.94 mg/dL (ref 0.57–1.00)
GFR calc Af Amer: 76 mL/min/{1.73_m2} (ref >59)
GFR calc non Af Amer: 66 mL/min/{1.73_m2} (ref >59)
Glucose: 136 mg/dL — ABNORMAL HIGH (ref 65–99)
Potassium: 4.2 mmol/L (ref 3.5–5.2)
Sodium: 144 mmol/L (ref 134–144)

## 2020-06-04 LAB — CBC
Hematocrit: 32.6 % — ABNORMAL LOW (ref 34.0–46.6)
Hemoglobin: 10.7 g/dL — ABNORMAL LOW (ref 11.1–15.9)
MCH: 30.4 pg (ref 26.6–33.0)
MCHC: 32.8 g/dL (ref 31.5–35.7)
MCV: 93 fL (ref 79–97)
Platelets: 45 10*3/uL — CL (ref 150–450)
RBC: 3.52 x10E6/uL — ABNORMAL LOW (ref 3.77–5.28)
RDW: 13.7 % (ref 11.7–15.4)
WBC: 2.2 10*3/uL — CL (ref 3.4–10.8)

## 2020-06-04 NOTE — Progress Notes (Signed)
Patient ID: Rebecca Jimenez, female   DOB: 1955-12-27, 64 y.o.   MRN: [id number] Patient enrolled for [employer] to ship a 7 day ZIO XT long term holter monitor to her home.

## 2020-06-09 ENCOUNTER — Ambulatory Visit (INDEPENDENT_AMBULATORY_CARE_PROVIDER_SITE_OTHER): Payer: Medicare PPO

## 2020-06-09 DIAGNOSIS — R002 Palpitations: Secondary | ICD-10-CM | POA: Diagnosis not present

## 2020-06-09 DIAGNOSIS — R42 Dizziness and giddiness: Secondary | ICD-10-CM

## 2020-06-09 DIAGNOSIS — R001 Bradycardia, unspecified: Secondary | ICD-10-CM | POA: Diagnosis not present

## 2020-06-21 ENCOUNTER — Other Ambulatory Visit: Payer: Self-pay | Admitting: General Practice

## 2020-06-21 DIAGNOSIS — R001 Bradycardia, unspecified: Secondary | ICD-10-CM

## 2020-06-21 DIAGNOSIS — R42 Dizziness and giddiness: Secondary | ICD-10-CM

## 2020-06-21 DIAGNOSIS — R002 Palpitations: Secondary | ICD-10-CM

## 2020-06-26 ENCOUNTER — Ambulatory Visit: Admit: 2020-06-26 | Discharge: 2020-06-27 | Payer: MEDICARE

## 2020-06-26 DIAGNOSIS — K7460 Unspecified cirrhosis of liver: Principal | ICD-10-CM

## 2020-06-26 MED ORDER — CHOLESTYRAMINE (WITH SUGAR) 4 GRAM POWDER FOR SUSP IN A PACKET
Freq: Two times a day (BID) | ORAL | 0 refills | 30 days | Status: CP
Start: 2020-06-26 — End: 2020-07-26

## 2020-06-28 ENCOUNTER — Encounter: Payer: Self-pay | Admitting: Gastroenterology

## 2020-07-10 NOTE — Progress Notes (Signed)
Cardiology Clinic Note   Patient Name: Rebecca Jimenez Date of Encounter: 07/15/2020  Primary Care Provider:  Myrlene Broker, MD Primary Cardiologist:  [name], MD  Patient Profile    Rebecca Jimenez 64 year old female presents to the clinic today for follow-up evaluation of her CHF and bradycardia.  Past Medical History    Past Medical History:  Diagnosis Date   Acute on chronic combined systolic and diastolic CHF (congestive heart failure) ([place]) 11/30/2019   Diabetes mellitus without complication (HCC)    Dry skin    Dry skin from sjorgrens   Hypertension    Irregular heart beat    NASH (nonalcoholic steatohepatitis)    Sjogren's syndrome ([place])    Sleep apnea    Stroke [facility])    Past Surgical History:  Procedure Laterality Date   [place]  2000, 2007   [place]   Face   ESOPHAGOGASTRODUODENOSCOPY  11/11/2017   Grade 1 esophageal varices (too small for EVL). Moderate portal hypertensive gastropathy. Otherwise, normal EGD   HAND SURGERY  1978, 1983   INGUINAL HERNIA REPAIR Left    KNEE SURGERY  2013   ROTATOR CUFF REPAIR Left    TONSILLECTOMY      Allergies  Allergies  Allergen Reactions   Ceftriaxone Shortness Of Breath    Throat swelling   Adenosine     Rapid heart rate   Codeine Rash   Morphine And Related     Unknown reaction   Oxycodone Nausea And Vomiting   Penicillins     Unknown reaction   Rocephin [Ceftriaxone Sodium In Dextrose]     Swollen throat   Sulfamethoxazole Nausea And Vomiting    History of Present Illness    Rebecca Jimenez has a PMH of Sjogren's syndrome, hypertension, diabetes type 2, NASH cirrhosis followed by Dr. Manuella Ghazi at [facility] liver center (confirmed by biopsy 2014), PVCs, nonischemic cardiomyopathy, obstructive sleep apnea and history of CVA. She underwent cardiac catheterization in 2009 which showed no significant CAD. Nuclear  stress test 9/17 showed EF 46% and no ischemia. Echocardiogram showed global hypokinesis with borderline LV dysfunction. She has a known chronic left bundle branch block. 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 (She requires clearance from liver specialist and thrombin protein receptor agonist such as Avatrombopagin the event surgery is required due to her risk of bleeding.)  She was seen and evaluated 9/20 for fluid volume overload and weight gain. Her Lasix was increased to 80 mg a.m. and 40 mg p.m. She had significant weight loss and improvement in her breathing. She was admitted to [facility] 2/21 with COVID-19 pneumonia. Her echocardiogram showed an EF of 35-40%. CXR showed diffuse pulmonary infiltrates. She was seen by Dr. Martinique virtually March 21. Her Lasix was increased to 80 mg twice daily for 3 days before going back to 80 mg a.m. and 40 mg p.m. her losartan was switched to [place] twice daily. Aldactone was added to her medication regimen. She was admitted 5/21 with chest pain. Coronary CT performed 01/24/2020 showed coronary calcium score of 40 placing her on 80th percentile for age and sex matched control. Evidence of mild mixed nonobstructive CAD. During her subsequent follow-up her Entresto dose was increased. A follow-up echocardiogram 6/21 showed an improvement in her LVEF to 45-50%.  She was last seen by Dr. Martinique on 04/02/2020. She indicated that she had been on  vacation at [facility] prior week. She was not able to monitor her weight at that time. She had gained 10 pounds. She noted increased abdominal girth and bilateral lower extremity edema. She indicated that she had been compliant with her medications, was doing her own food preparation and staying away from sodium. She felt that her breathing was more difficult and she had decreased energy. A recent MRI of her abdomen showed cirrhosis with portal  hypertension, varices and small amount of ascites. Her Lasix was increased to 80 mg twice daily, Aldactone increased to 25 mg daily, and her BMP was stable (04/16/2020).  She presented to the clinic 04/23/20 for follow-up evaluation and statedher weight had continued to decrease. She was down to 279 pounds . She had much less lower extremity edema and stated her shoes were fitting much better. She had noticed that her heart rate had been lower in the 40s at times,  it is 55 bpm during her visit. I  decreased her carvedilol to 3.125. We will keep her furosemide at 80 mg twice daily Monday Wednesday Friday Sunday. We will keep her furosemide at 80 mg - 40 mg p.m. Tuesday Thursday Saturday. I  encouraged her to maintain her fluid restriction and wear lower extremity support stockings. BMP was stable on repeat. I planned follow-up in 1 month.  She presented to the clinic 06/03/2020 for follow-up evaluation and stated she had increased fatigue.  Her heart rates had been staying in the 40s even with a reduced dose of her carvedilol.  She had also been noticing dark stools and had a nosebleed several days ago.  She previously saw Dr. Lyndel Safe for her GI health however, he left [place] and she had not reestablish care.  She wished to follow-up with him we discussed possibly needing a follow-up EGD/colonoscopy.  She also indicates that she had been noticing more frequent palpitations and had some dizziness.  I  ordered echo, 7 day zio monitor, order CBC, BMP, gave the [place] support stockings sheet encourage a low-sodium diet.  We  stopped her carvedilol and planned 6 to 8 weeks for follow-up.  Cardiac event monitor 06/21/2020 showed PVCs, no significant bradycardia.  She presents to the clinic today for follow-up evaluation states she is scheduled appointment in December with GI.  Her dizziness is much improved since her carvedilol has been discontinued.  She indicates she needs to lose around 70 pounds  before she can be placed on any liver transplant list.  We discussed  and wellness and weight loss which she would like to try.  I will refer her to the weight loss program, have her continue to monitor her blood pressure, increase her physical activity as tolerated, and follow a low-sodium diet.  We will have her follow-up in 6 months.  Today shedenies chest pain, shortness of breath, lower extremity edema, fatigue, palpitations, melena, hematuria, hemoptysis, diaphoresis, weakness, presyncope, syncope, orthopnea, and PND.  Home Medications    Prior to Admission medications   Medication Sig Start Date End Date Taking? Authorizing Provider  Dulaglutide (TRULICITY) 0.62 BJ/6.2GB SOPN Inject into the skin once a week.    [provider]  furosemide (LASIX) 80 MG tablet Take 80 mg twice a day on Mon, Wed, Fri, and Sun.Then take 80 mg in the morning and 40 mg (1/2 tablet) evening on Tues, Thurs, and Sat 04/23/20   Savanah Bayles, Jossie Ng, NP  glipiZIDE (GLUCOTROL) 5 MG tablet Take by mouth daily before breakfast.  [provider]  hydrOXYzine (ATARAX/VISTARIL) 50 MG tablet 1/2 to 1 tablet nightly as needed 02/02/20   [provider]  potassium chloride SA (K-DUR) 20 MEQ tablet Take 1 tablet (20 mEq total) by mouth 2 (two) times daily. 05/25/19   Almyra Deforest, PA  sacubitril-valsartan (ENTRESTO) 97-103 MG Take 1 tablet by mouth 2 (two) times daily. 02/14/20   Almyra Deforest, PA  spironolactone (ALDACTONE) 25 MG tablet Take 1 tablet (25 mg total) by mouth daily. 04/02/20 03/28/21  Martinique, Peter M, MD  Vitamin D, Ergocalciferol, (DRISDOL) 1.25 MG (50000 UNIT) CAPS capsule Take 50,000 Units by mouth every 7 (seven) days.    [provider]    Family History    Family History  Problem Relation Age of Onset   Diabetes Mother    Cerebrovascular Accident Father    Diabetes Father    Cancer - Prostate Father    Heart disease Father    Diabetes Brother    She indicated  that her mother is alive. She indicated that her father is deceased. She indicated that her brother is alive. She indicated that her maternal grandmother is deceased. She indicated that her maternal grandfather is deceased. She indicated that her paternal grandmother is deceased. She indicated that her paternal grandfather is deceased.  Social History    Social History   Socioeconomic History   Marital status: Married    Spouse name: [name]   Number of children: 2   Years of education: [employer]   Highest education level: Not on file  Occupational History   Occupation: Disabled  Tobacco Use   Smoking status: Former Smoker    Packs/day: 0.25    Types: Cigarettes    Quit date: 05/11/2015    Years since quitting: 5.1   Smokeless tobacco: Never Used  Vaping Use   Vaping Use: Never used  Substance and Sexual Activity   Alcohol use: Yes    Comment: Consumes alcohol occasionally   Drug use: No   Sexual activity: Not on file  Other Topics Concern   Not on file  Social History Narrative   Patient lives at home with her Husband [name]). Patient is disabled. Patient has two children. Caffeine three cups. Left handed.    Social Determinants of Health   Financial Resource Strain:    Difficulty of Paying Living Expenses: Not on file  Food Insecurity:    Worried About Charity fundraiser in the Last Year: Not on file   [employer] of Food in the Last Year: Not on file  Transportation Needs:    Lack of Transportation (Medical): Not on file   Lack of Transportation (Non-Medical): Not on file  Physical Activity:    Days of Exercise per Week: Not on file   Minutes of Exercise per Session: Not on file  Stress:    Feeling of Stress : Not on file  Social Connections:    Frequency of Communication with Friends and Family: Not on file   Frequency of Social Gatherings with Friends and Family: Not on file   Attends Religious Services: Not on file   Active Member of  Clubs or Organizations: Not on file   Attends Archivist Meetings: Not on file   Marital Status: Not on file  Intimate Partner Violence:    Fear of Current or Ex-Partner: Not on file   Emotionally Abused: Not on file   Physically Abused: Not on file   Sexually Abused: Not on file  Review of Systems    General:  No chills, fever, night sweats or weight changes.  Cardiovascular:  No chest pain, dyspnea on exertion, edema, orthopnea, palpitations, paroxysmal nocturnal dyspnea. Dermatological: No rash, lesions/masses Respiratory: No cough, dyspnea Urologic: No hematuria, dysuria Abdominal:   No nausea, vomiting, diarrhea, bright red blood per rectum, melena, or hematemesis Neurologic:  No visual changes, wkns, changes in mental status. All other systems reviewed and are otherwise negative except as noted above.  Physical Exam    VS:  BP 134/64 (BP Location: Left Arm, Patient Position: Sitting, Cuff Size: Large)    Pulse 85    Ht 5' 3"  (1.6 m)    Wt 295 lb (133.8 kg)    BMI 52.26 kg/m  , BMI Body mass index is 52.26 kg/m. GEN: Well nourished, well developed, in no acute distress. HEENT: normal. Neck: Supple, no JVD, carotid bruits, or masses. Cardiac: RRR, no murmurs, rubs, or gallops. No clubbing, cyanosis, edema.  Radials/DP/PT 2+ and equal bilaterally.  Respiratory:  Respirations regular and unlabored, clear to auscultation bilaterally. GI: Soft, nontender, nondistended, BS + x 4. MS: no deformity or atrophy. Skin: warm and dry, no rash. Neuro:  Strength and sensation are intact. Psych: Normal affect.  Accessory Clinical Findings    Recent Labs: 01/24/2020: TSH 2.617 01/25/2020: ALT 21; B Natriuretic Peptide 85.6; Magnesium 2.0 06/03/2020: BUN 19; Creatinine, Ser 0.94; Hemoglobin 10.7; Platelets 45; Potassium 4.2; Sodium 144   Recent Lipid Panel    Component Value Date/Time   CHOL 182 01/25/2020 0245   CHOL 186 06/01/2019 1036   TRIG 131 01/25/2020 0245    HDL 42 01/25/2020 0245   HDL 55 06/01/2019 1036   CHOLHDL 4.3 01/25/2020 0245   VLDL 26 01/25/2020 0245   LDLCALC 114 (H) 01/25/2020 0245   LDLCALC 103 (H) 06/01/2019 1036    ECG personally reviewed by me today-none today.  EKG 06/03/2020 normal sinus rhythm left axis deviation with murmur block 70 bpm- No acute changes  Echocardiogram 02/27/2020 IMPRESSIONS    1. Left ventricular ejection fraction, by estimation, is 45 to 50%. The  left ventricle has mildly decreased function. The left ventricle  demonstrates regional wall motion abnormalities (see scoring  diagram/findings for description). There is mild  concentric left ventricular hypertrophy. Left ventricular diastolic  parameters are consistent with Grade I diastolic dysfunction (impaired  relaxation). Elevated left ventricular end-diastolic pressure.  2. Right ventricular systolic function is normal. The right ventricular  size is normal. There is normal pulmonary artery systolic pressure.  3. The mitral valve is normal in structure. Trivial mitral valve  regurgitation. No evidence of mitral stenosis.  4. The aortic valve is normal in structure. Aortic valve regurgitation is  not visualized. No aortic stenosis is present.  5. The inferior vena cava is normal in size with greater than 50%  respiratory variability, suggesting right atrial pressure of 3 mmHg.  Cardiac event monitor 06/21/2020  Normal sinus rhythm  Occasional PVCs  One 4 beat run NSVT  One 4 beat run SVT  One 8 beat run AIVR   Assessment & Plan   1.   Bradycardia-heart rate today 85 bpm.  Reviewed results of cardiac event monitor.  Discussed accuracy of watch monitoring.  Heart healthy low-sodium diet Increase physical activity as tolerated  Continue to monitor.  Nonischemic cardiomyopathy-continues with no increased work of breathing. Echocardiogram 02/27/2020 showed LVEF of 45-50%, and G1 DD.  Continue furosemide 80 mg twice daily Monday  Wednesday Friday Sunday Continuefurosemide  80 mg a.m. and 80m p.m.Tuesday Thursday Saturday Continue carvedilol, Entresto, spironolactone Daily weights-May take an extra dose of furosemide with a weight increase of 3 pounds overnight or 5 pounds in 1 week. Fluid restriction-64 ounces daily Lower extremity support stockings-[place] support stockings sheet reviewed and given. Heart healthy low-sodium diet-salty 6 given Increase physical activity as tolerated   Essential hypertension-BP today 134/64. Well-controlled at home. ContinueEntresto, spironolactone Heart healthy low-sodium diet-salty 6 given Increase physical activity as tolerated Maintain blood pressure log  Hepatic cirrhosis with portal hypertension-monitor/followed by Dr. SJimmie Mollyliver specialist  Type 2 diabetes-A1c 6.0 on 01/25/2020 Continue glipizide, Trulicity Followed by PCP  Possible GI bleed-no more reports of dark or epistaxis. Has re-establish care and is being evaluated by Dr. GLyndel Safe  Follow-up with Dr. JMartiniqueor me in 6 months.  JJossie Ng Tudor Chandley NP-C    07/15/2020, 4:13 PM [facility] [address] Office [phone] [phone] Notice: This dictation was prepared with Dragon dictation along with smaller phrase technology. Any transcriptional errors that result from this process are unintentional and may not be corrected upon review.

## 2020-07-15 ENCOUNTER — Encounter: Payer: Self-pay | Admitting: General Practice

## 2020-07-15 ENCOUNTER — Other Ambulatory Visit: Payer: Self-pay

## 2020-07-15 ENCOUNTER — Ambulatory Visit (INDEPENDENT_AMBULATORY_CARE_PROVIDER_SITE_OTHER): Payer: Medicare PPO | Admitting: General Practice

## 2020-07-15 VITALS — BP 134/64 | HR 85 | Ht 63.0 in | Wt 295.0 lb

## 2020-07-15 DIAGNOSIS — R001 Bradycardia, unspecified: Secondary | ICD-10-CM

## 2020-07-15 DIAGNOSIS — I428 Other cardiomyopathies: Secondary | ICD-10-CM | POA: Diagnosis not present

## 2020-07-15 DIAGNOSIS — K7460 Unspecified cirrhosis of liver: Secondary | ICD-10-CM

## 2020-07-15 DIAGNOSIS — K7581 Nonalcoholic steatohepatitis (NASH): Secondary | ICD-10-CM

## 2020-07-15 DIAGNOSIS — E119 Type 2 diabetes mellitus without complications: Secondary | ICD-10-CM

## 2020-07-15 DIAGNOSIS — K922 Gastrointestinal hemorrhage, unspecified: Secondary | ICD-10-CM

## 2020-07-15 DIAGNOSIS — R634 Abnormal weight loss: Secondary | ICD-10-CM

## 2020-07-15 DIAGNOSIS — I10 Essential (primary) hypertension: Secondary | ICD-10-CM | POA: Diagnosis not present

## 2020-07-15 NOTE — Patient Instructions (Signed)
Medication Instructions:  The current medical regimen is effective;  continue present plan and medications as directed. Please refer to the Current Medication list given to you today.  *If you need a refill on your cardiac medications before your next appointment, please call your pharmacy*  Lab Work:   Testing/Procedures:  NONE    NONE  Special Instructions REFERRAL TO MEDICAL WEIGHT-LOSS  PLEASE USE NASAL SALINE SPRAY FOR NOSE/NOSEBLEEDS  PLEASE WEAR YOUR COMPRESSION STOCKINGS AND ELEVATE LEGS WHEN ABLE  PLEASE READ AND FOLLOW SALTY 6-ATTACHED  PLEASE INCREASE PHYSICAL ACTIVITY AS TOLERATED  Follow-Up: Your next appointment:  6 month(s) In Person with [name], MD -[place], FNP-C Please call our office 2 months in advance to schedule this appointment   At [facility], you and your health needs are our priority.  As part of our continuing mission to provide you with exceptional heart care, we have created designated Provider Care Teams.  These Care Teams include your primary Cardiologist (physician) and Advanced Practice Providers (APPs -  Physician Assistants and Nurse Practitioners) who all work together to provide you with the care you need, when you need it.  We recommend signing up for the patient portal called "MyChart".  Sign up information is provided on this After Visit Summary.  MyChart is used to connect with patients for Virtual Visits (Telemedicine).  Patients are able to view lab/test results, encounter notes, upcoming appointments, etc.  Non-urgent messages can be sent to your provider as well.   To learn more about what you can do with MyChart, go to [email or web address].

## 2020-08-12 ENCOUNTER — Other Ambulatory Visit: Payer: Self-pay | Admitting: Physician Assistant

## 2020-08-23 ENCOUNTER — Encounter: Payer: Self-pay | Admitting: Gastroenterology

## 2020-08-23 ENCOUNTER — Encounter (INDEPENDENT_AMBULATORY_CARE_PROVIDER_SITE_OTHER): Payer: Self-pay

## 2020-08-23 ENCOUNTER — Ambulatory Visit (INDEPENDENT_AMBULATORY_CARE_PROVIDER_SITE_OTHER): Payer: Medicare PPO | Admitting: Gastroenterology

## 2020-08-23 VITALS — BP 128/68 | HR 73 | Ht 63.0 in | Wt 294.0 lb

## 2020-08-23 DIAGNOSIS — K7460 Unspecified cirrhosis of liver: Secondary | ICD-10-CM

## 2020-08-23 DIAGNOSIS — I8500 Esophageal varices without bleeding: Secondary | ICD-10-CM

## 2020-08-23 DIAGNOSIS — D649 Anemia, unspecified: Secondary | ICD-10-CM | POA: Diagnosis not present

## 2020-08-23 DIAGNOSIS — K7581 Nonalcoholic steatohepatitis (NASH): Secondary | ICD-10-CM | POA: Diagnosis not present

## 2020-08-23 MED ORDER — NA SULFATE-K SULFATE-MG SULF 17.5-3.13-1.6 GM/177ML PO SOLN: 1.0000 | Freq: Once | ORAL | 0 refills | Status: AC

## 2020-08-23 NOTE — Patient Instructions (Addendum)
If you are age 62 or older, your body mass index should be between 23-30. Your Body mass index is 52.08 kg/m. If this is out of the aforementioned range listed, please consider follow up with your Primary Care Provider.  If you are age 21 or younger, your body mass index should be between 19-25. Your Body mass index is 52.08 kg/m. If this is out of the aformentioned range listed, please consider follow up with your Primary Care Provider.   You are scheduled for a procedure at [facility] on 09/24/2020 at 830am. They will contact you prior with pre admission information.  We have sent the following medications to your pharmacy for you to pick up at your convenience: suprep for colonoscopy  Due to recent COVID-19 restrictions implemented by our local and state authorities and in an effort to keep both patients and staff as safe as possible, our hospital system now requires COVID-19 testing prior to any scheduled hospital procedure. Please go to [place], [place], [place] [address] on 12/30/2021at  11:00am. This is a drive up testing site, you will not need to exit your vehicle.  You will not be billed at the time of testing but may receive a bill later depending on your insurance. The approximate cost of the test is $100. You must agree to quarantine from the time of your testing until the procedure date on 09/24/2020 . This should include staying at home with ONLY the people you live with. Avoid take-out, grocery store shopping or leaving the house for any non-emergent reason. Failure to have your COVID-19 test done on the date and time you have been scheduled will result in cancellation of procedure. Please call our office at [phone] if you have any questions.   Due to recent changes in healthcare laws, you may see the results of your imaging and laboratory studies on MyChart before your provider has had a chance to review them.  We understand that in some cases there may be results that are  confusing or concerning to you. Not all laboratory results come back in the same time frame and the provider may be waiting for multiple results in order to interpret others.  Please give [place] 48 hours in order for your provider to thoroughly review all the results before contacting the office for clarification of your results.    It was a pleasure to see you today!  Dr. Lyndel Safe
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
## 2020-09-12 ENCOUNTER — Other Ambulatory Visit: Payer: Self-pay

## 2020-09-12 ENCOUNTER — Encounter (HOSPITAL_COMMUNITY): Payer: Self-pay | Admitting: Gastroenterology

## 2020-09-19 ENCOUNTER — Other Ambulatory Visit: Payer: Self-pay | Admitting: General Surgery

## 2020-09-19 ENCOUNTER — Other Ambulatory Visit (HOSPITAL_COMMUNITY): Payer: Medicare PPO

## 2020-09-19 DIAGNOSIS — I8500 Esophageal varices without bleeding: Secondary | ICD-10-CM

## 2020-09-20 ENCOUNTER — Other Ambulatory Visit (HOSPITAL_COMMUNITY)
Admission: RE | Admit: 2020-09-20 | Discharge: 2020-09-20 | Disposition: A | Discharge status: Home / Self Care | Payer: Medicare PPO | Source: Ambulatory Visit | Attending: Gastroenterology | Admitting: Gastroenterology

## 2020-09-20 DIAGNOSIS — Z01812 Encounter for preprocedural laboratory examination: Secondary | ICD-10-CM | POA: Insufficient documentation

## 2020-09-20 DIAGNOSIS — Z20822 Contact with and (suspected) exposure to covid-19: Secondary | ICD-10-CM | POA: Diagnosis not present

## 2020-09-20 LAB — SARS CORONAVIRUS 2 (TAT 6-24 HRS): SARS Coronavirus 2: NEGATIVE

## 2020-09-24 ENCOUNTER — Other Ambulatory Visit: Payer: Self-pay

## 2020-09-24 ENCOUNTER — Encounter (HOSPITAL_COMMUNITY)
Admission: RE | Disposition: A | Discharge status: Home / Self Care | Payer: Self-pay | Source: Home / Self Care | Attending: Gastroenterology

## 2020-09-24 ENCOUNTER — Ambulatory Visit (HOSPITAL_COMMUNITY)
Admission: RE | Admit: 2020-09-24 | Discharge: 2020-09-24 | Disposition: A | Discharge status: Home / Self Care | Payer: Medicare PPO | Attending: Gastroenterology | Admitting: Gastroenterology

## 2020-09-24 ENCOUNTER — Ambulatory Visit (HOSPITAL_COMMUNITY): Payer: Medicare PPO | Admitting: Certified Registered"

## 2020-09-24 ENCOUNTER — Encounter (HOSPITAL_COMMUNITY): Payer: Self-pay | Admitting: Gastroenterology

## 2020-09-24 DIAGNOSIS — Z8379 Family history of other diseases of the digestive system: Secondary | ICD-10-CM | POA: Insufficient documentation

## 2020-09-24 DIAGNOSIS — Z8616 Personal history of COVID-19: Secondary | ICD-10-CM | POA: Diagnosis not present

## 2020-09-24 DIAGNOSIS — K766 Portal hypertension: Secondary | ICD-10-CM | POA: Insufficient documentation

## 2020-09-24 DIAGNOSIS — Z8249 Family history of ischemic heart disease and other diseases of the circulatory system: Secondary | ICD-10-CM | POA: Insufficient documentation

## 2020-09-24 DIAGNOSIS — K644 Residual hemorrhoidal skin tags: Secondary | ICD-10-CM | POA: Insufficient documentation

## 2020-09-24 DIAGNOSIS — I428 Other cardiomyopathies: Secondary | ICD-10-CM | POA: Diagnosis not present

## 2020-09-24 DIAGNOSIS — R161 Splenomegaly, not elsewhere classified: Secondary | ICD-10-CM | POA: Insufficient documentation

## 2020-09-24 DIAGNOSIS — Z6841 Body Mass Index (BMI) 40.0 and over, adult: Secondary | ICD-10-CM | POA: Diagnosis not present

## 2020-09-24 DIAGNOSIS — N189 Chronic kidney disease, unspecified: Secondary | ICD-10-CM | POA: Diagnosis not present

## 2020-09-24 DIAGNOSIS — Z1211 Encounter for screening for malignant neoplasm of colon: Secondary | ICD-10-CM | POA: Diagnosis not present

## 2020-09-24 DIAGNOSIS — Z882 Allergy status to sulfonamides status: Secondary | ICD-10-CM | POA: Insufficient documentation

## 2020-09-24 DIAGNOSIS — K7469 Other cirrhosis of liver: Secondary | ICD-10-CM

## 2020-09-24 DIAGNOSIS — Z79899 Other long term (current) drug therapy: Secondary | ICD-10-CM | POA: Insufficient documentation

## 2020-09-24 DIAGNOSIS — Z87442 Personal history of urinary calculi: Secondary | ICD-10-CM | POA: Insufficient documentation

## 2020-09-24 DIAGNOSIS — K2950 Unspecified chronic gastritis without bleeding: Secondary | ICD-10-CM | POA: Diagnosis not present

## 2020-09-24 DIAGNOSIS — K7581 Nonalcoholic steatohepatitis (NASH): Secondary | ICD-10-CM | POA: Insufficient documentation

## 2020-09-24 DIAGNOSIS — K5520 Angiodysplasia of colon without hemorrhage: Secondary | ICD-10-CM | POA: Insufficient documentation

## 2020-09-24 DIAGNOSIS — G4733 Obstructive sleep apnea (adult) (pediatric): Secondary | ICD-10-CM | POA: Diagnosis not present

## 2020-09-24 DIAGNOSIS — I5042 Chronic combined systolic (congestive) and diastolic (congestive) heart failure: Secondary | ICD-10-CM | POA: Diagnosis not present

## 2020-09-24 DIAGNOSIS — K3189 Other diseases of stomach and duodenum: Secondary | ICD-10-CM | POA: Diagnosis not present

## 2020-09-24 DIAGNOSIS — M3500 Sicca syndrome, unspecified: Secondary | ICD-10-CM | POA: Insufficient documentation

## 2020-09-24 DIAGNOSIS — E1122 Type 2 diabetes mellitus with diabetic chronic kidney disease: Secondary | ICD-10-CM | POA: Insufficient documentation

## 2020-09-24 DIAGNOSIS — E669 Obesity, unspecified: Secondary | ICD-10-CM | POA: Diagnosis not present

## 2020-09-24 DIAGNOSIS — D631 Anemia in chronic kidney disease: Secondary | ICD-10-CM | POA: Diagnosis not present

## 2020-09-24 DIAGNOSIS — D696 Thrombocytopenia, unspecified: Secondary | ICD-10-CM | POA: Diagnosis not present

## 2020-09-24 DIAGNOSIS — K648 Other hemorrhoids: Secondary | ICD-10-CM | POA: Insufficient documentation

## 2020-09-24 DIAGNOSIS — K2970 Gastritis, unspecified, without bleeding: Secondary | ICD-10-CM

## 2020-09-24 DIAGNOSIS — K5730 Diverticulosis of large intestine without perforation or abscess without bleeding: Secondary | ICD-10-CM | POA: Diagnosis not present

## 2020-09-24 DIAGNOSIS — I8510 Secondary esophageal varices without bleeding: Secondary | ICD-10-CM | POA: Insufficient documentation

## 2020-09-24 DIAGNOSIS — J449 Chronic obstructive pulmonary disease, unspecified: Secondary | ICD-10-CM | POA: Insufficient documentation

## 2020-09-24 DIAGNOSIS — D649 Anemia, unspecified: Secondary | ICD-10-CM

## 2020-09-24 DIAGNOSIS — Z880 Allergy status to penicillin: Secondary | ICD-10-CM | POA: Insufficient documentation

## 2020-09-24 DIAGNOSIS — Z885 Allergy status to narcotic agent status: Secondary | ICD-10-CM | POA: Insufficient documentation

## 2020-09-24 DIAGNOSIS — I130 Hypertensive heart and chronic kidney disease with heart failure and stage 1 through stage 4 chronic kidney disease, or unspecified chronic kidney disease: Secondary | ICD-10-CM | POA: Diagnosis not present

## 2020-09-24 DIAGNOSIS — K7460 Unspecified cirrhosis of liver: Secondary | ICD-10-CM

## 2020-09-24 DIAGNOSIS — Z888 Allergy status to other drugs, medicaments and biological substances status: Secondary | ICD-10-CM | POA: Insufficient documentation

## 2020-09-24 DIAGNOSIS — Z8042 Family history of malignant neoplasm of prostate: Secondary | ICD-10-CM | POA: Insufficient documentation

## 2020-09-24 DIAGNOSIS — Z8673 Personal history of transient ischemic attack (TIA), and cerebral infarction without residual deficits: Secondary | ICD-10-CM | POA: Insufficient documentation

## 2020-09-24 DIAGNOSIS — Z7984 Long term (current) use of oral hypoglycemic drugs: Secondary | ICD-10-CM | POA: Insufficient documentation

## 2020-09-24 DIAGNOSIS — I8500 Esophageal varices without bleeding: Secondary | ICD-10-CM

## 2020-09-24 DIAGNOSIS — Z881 Allergy status to other antibiotic agents status: Secondary | ICD-10-CM | POA: Insufficient documentation

## 2020-09-24 DIAGNOSIS — Z833 Family history of diabetes mellitus: Secondary | ICD-10-CM | POA: Insufficient documentation

## 2020-09-24 HISTORY — PX: BIOPSY: SHX5522

## 2020-09-24 HISTORY — DX: Other specified postprocedural states: R11.2

## 2020-09-24 HISTORY — DX: Other specified postprocedural states: Z98.890

## 2020-09-24 HISTORY — PX: ESOPHAGEAL BANDING: SHX5518

## 2020-09-24 HISTORY — PX: COLONOSCOPY WITH PROPOFOL: SHX5780

## 2020-09-24 HISTORY — PX: ESOPHAGOGASTRODUODENOSCOPY (EGD) WITH PROPOFOL: SHX5813

## 2020-09-24 LAB — CBC
HCT: 33.6 % — ABNORMAL LOW (ref 36.0–46.0)
Hemoglobin: 10.7 g/dL — ABNORMAL LOW (ref 12.0–15.0)
MCH: 31.1 pg (ref 26.0–34.0)
MCHC: 31.8 g/dL (ref 30.0–36.0)
MCV: 97.7 fL (ref 80.0–100.0)
Platelets: 45 10*3/uL — ABNORMAL LOW (ref 150–400)
RBC: 3.44 MIL/uL — ABNORMAL LOW (ref 3.87–5.11)
RDW: 15.3 % (ref 11.5–15.5)
WBC: 3.5 10*3/uL — ABNORMAL LOW (ref 4.0–10.5)
nRBC: 0 % (ref 0.0–0.2)

## 2020-09-24 LAB — COMPREHENSIVE METABOLIC PANEL
ALT: 26 U/L (ref 0–44)
AST: 34 U/L (ref 15–41)
Albumin: 3.6 g/dL (ref 3.5–5.0)
Alkaline Phosphatase: 156 U/L — ABNORMAL HIGH (ref 38–126)
Anion gap: 12 (ref 5–15)
BUN: 17 mg/dL (ref 8–23)
CO2: 28 mmol/L (ref 22–32)
Calcium: 9.1 mg/dL (ref 8.9–10.3)
Chloride: 101 mmol/L (ref 98–111)
Creatinine, Ser: 1.08 mg/dL — ABNORMAL HIGH (ref 0.44–1.00)
GFR, Estimated: 58 mL/min — ABNORMAL LOW (ref >60)
Glucose, Bld: 123 mg/dL — ABNORMAL HIGH (ref 70–99)
Potassium: 3.5 mmol/L (ref 3.5–5.1)
Sodium: 141 mmol/L (ref 135–145)
Total Bilirubin: 2.5 mg/dL — ABNORMAL HIGH (ref 0.3–1.2)
Total Protein: 6.6 g/dL (ref 6.5–8.1)

## 2020-09-24 LAB — PROTIME-INR
INR: 1.2 (ref 0.8–1.2)
Prothrombin Time: 15.1 seconds (ref 11.4–15.2)

## 2020-09-24 LAB — GLUCOSE, CAPILLARY: Glucose-Capillary: 102 mg/dL — ABNORMAL HIGH (ref 70–99)

## 2020-09-24 SURGERY — ESOPHAGOGASTRODUODENOSCOPY (EGD) WITH PROPOFOL
Anesthesia: Monitor Anesthesia Care

## 2020-09-24 MED ORDER — PROPOFOL 10 MG/ML IV BOLUS
INTRAVENOUS | Status: DC | PRN
  Administered 2020-09-24 (×14): 40 mg via INTRAVENOUS

## 2020-09-24 MED ORDER — PANTOPRAZOLE SODIUM 40 MG PO TBEC: 40.0000 mg | DELAYED_RELEASE_TABLET | Freq: Every day | ORAL | 3 refills | Status: DC

## 2020-09-24 MED ORDER — LIDOCAINE 2% (20 MG/ML) 5 ML SYRINGE
INTRAMUSCULAR | Status: DC | PRN
  Administered 2020-09-24: 60 mg via INTRAVENOUS

## 2020-09-24 MED ORDER — SODIUM CHLORIDE 0.9 % IV SOLN: INTRAVENOUS | Status: DC

## 2020-09-24 MED ORDER — LACTATED RINGERS IV SOLN
INTRAVENOUS | Status: DC
  Administered 2020-09-24: 1000 mL via INTRAVENOUS

## 2020-09-24 SURGICAL SUPPLY — 24 items
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
## 2020-09-24 NOTE — Discharge Instructions (Signed)
SOFT FOODS ONLY TODAY THEN ADVANCE DIET AT TOLERATED TOMORROW. TAKE SMALL BITES AND CHEW FOOD WELL.   YOU HAD AN ENDOSCOPIC PROCEDURE TODAY: Refer to the procedure report and other information in the discharge instructions given to you for any specific questions about what was found during the examination. If this information does not answer your questions, please call [place] office at [phone] to clarify.   YOU SHOULD EXPECT: Some feelings of bloating in the abdomen. Passage of more gas than usual. Walking can help get rid of the air that was put into your GI tract during the procedure and reduce the bloating. If you had a lower endoscopy (such as a colonoscopy or flexible sigmoidoscopy) you may notice spotting of blood in your stool or on the toilet paper. Some abdominal soreness may be present for a day or two, also.  DIET: Your first meal following the procedure should be a light meal and then it is ok to progress to your normal diet. A half-sandwich or bowl of soup is an example of a good first meal. Heavy or fried foods are harder to digest and may make you feel nauseous or bloated. Drink plenty of fluids but you should avoid alcoholic beverages for 24 hours. If you had a esophageal dilation, please see attached instructions for diet.    ACTIVITY: Your care partner should take you home directly after the procedure. You should plan to take it easy, moving slowly for the rest of the day. You can resume normal activity the day after the procedure however YOU SHOULD NOT DRIVE, use power tools, machinery or perform tasks that involve climbing or major physical exertion for 24 hours (because of the sedation medicines used during the test).   SYMPTOMS TO REPORT IMMEDIATELY: A gastroenterologist can be reached at any hour. Please call [phone]  for any of the following symptoms:  . Following lower endoscopy (colonoscopy, flexible sigmoidoscopy) Excessive amounts of blood in the stool   Significant tenderness, worsening of abdominal pains  Swelling of the abdomen that is new, acute  Fever of 100 or higher  . Following upper endoscopy (EGD, EUS, ERCP, esophageal dilation) Vomiting of blood or coffee ground material  New, significant abdominal pain  New, significant chest pain or pain under the shoulder blades  Painful or persistently difficult swallowing  New shortness of breath  Black, tarry-looking or red, bloody stools  FOLLOW UP:  If any biopsies were taken you will be contacted by phone or by letter within the next 1-3 weeks. Call [phone]  if you have not heard about the biopsies in 3 weeks.  Please also call with any specific questions about appointments or follow up tests.

## 2020-09-24 NOTE — Anesthesia Preprocedure Evaluation (Signed)
Anesthesia Evaluation  Patient identified by MRN, date of birth, ID band Patient awake    History of Anesthesia Complications (+) PONV  Airway Mallampati: II  TM Distance: >3 FB     Dental   Pulmonary sleep apnea , former smoker,    breath sounds clear to auscultation       Cardiovascular hypertension, +CHF  + dysrhythmias  Rhythm:Regular Rate:Normal     Neuro/Psych  Headaches, CVA    GI/Hepatic (+) Hepatitis -History noted CG   Endo/Other  diabetes  Renal/GU Renal disease     Musculoskeletal   Abdominal   Peds  Hematology   Anesthesia Other Findings   Reproductive/Obstetrics                             Anesthesia Physical Anesthesia Plan  ASA: III  Anesthesia Plan: MAC   Post-op Pain Management:    Induction: Intravenous  PONV Risk Score and Plan: 3 and Ondansetron, Dexamethasone and Midazolam  Airway Management Planned: Nasal Cannula and Simple Face Mask  Additional Equipment:   Intra-op Plan:   Post-operative Plan:   Informed Consent: I have reviewed the patients History and Physical, chart, labs and discussed the procedure including the risks, benefits and alternatives for the proposed anesthesia with the patient or authorized representative who has indicated his/her understanding and acceptance.     Dental advisory given  Plan Discussed with: CRNA and Anesthesiologist  Anesthesia Plan Comments:         Anesthesia Quick Evaluation

## 2020-09-24 NOTE — Anesthesia Procedure Notes (Signed)
Procedure Name: MAC Date/Time: 09/24/2020 8:40 AM Performed by: Cynda Familia, CRNA Pre-anesthesia Checklist: Patient identified, Emergency Drugs available, Suction available, Patient being monitored and Timeout performed Oxygen Delivery Method: Simple face mask Placement Confirmation: positive ETCO2 and breath sounds checked- equal and bilateral Dental Injury: Teeth and Oropharynx as per pre-operative assessment  Comments: Bite block by RN-- pt with very poor dentition-- many chipped broken teeth-- unchanged with bite block

## 2020-09-24 NOTE — Op Note (Signed)
[facility] Patient Name: Rebecca Jimenez Procedure Date: 09/24/2020 MRN: [id number] Attending MD: Jackquline Denmark , MD Date of Birth: 03-30-1956 CSN: [id number] Age: 65 Admit Type: Outpatient Procedure:                Colonoscopy Indications:              Screening for colorectal malignant neoplasm Providers:                Jackquline Denmark, MD, Particia Nearing, RN, William Dalton, Technician Referring MD:              Medicines:                Monitored Anesthesia Care Complications:            No immediate complications. Estimated Blood Loss:     Estimated blood loss: none. Procedure:                Pre-Anesthesia Assessment:                           - Prior to the procedure, a History and Physical                            was performed, and patient medications and                            allergies were reviewed. The patient's tolerance of                            previous anesthesia was also reviewed. The risks                            and benefits of the procedure and the sedation                            options and risks were discussed with the patient.                            All questions were answered, and informed consent                            was obtained. Prior Anticoagulants: The patient has                            taken no previous anticoagulant or antiplatelet                            agents. ASA Grade Assessment: III - A patient with                            severe systemic disease. After reviewing the risks  and benefits, the patient was deemed in                            satisfactory condition to undergo the procedure.                           - Prior to the procedure, a History and Physical                            was performed, and patient medications and                            allergies were reviewed. The patient's tolerance of                            previous  anesthesia was also reviewed. The risks                            and benefits of the procedure and the sedation                            options and risks were discussed with the patient.                            All questions were answered, and informed consent                            was obtained. Prior Anticoagulants: The patient has                            taken no previous anticoagulant or antiplatelet                            agents. ASA Grade Assessment: III - A patient with                            severe systemic disease. After reviewing the risks                            and benefits, the patient was deemed in                            satisfactory condition to undergo the procedure.                           After obtaining informed consent, the colonoscope                            was passed under direct vision. Throughout the                            procedure, the patient's blood pressure, pulse, and  oxygen saturations were monitored continuously. The                            CF-HQ190L ([id number]) Olympus colonoscope was                            introduced through the anus and advanced to the 2                            cm into the ileum. The colonoscopy was performed                            without difficulty. The patient tolerated the                            procedure well. The quality of the bowel                            preparation was good. The terminal ileum, ileocecal                            valve, appendiceal orifice, and rectum were                            photographed. Scope In: 9:12:04 AM Scope Out: 9:24:40 AM Scope Withdrawal Time: 0 hours 7 minutes 26 seconds  Total Procedure Duration: 0 hours 12 minutes 36 seconds  Findings:      Three AVMs without bleeding were found in the ascending colon. 2 small       and one medium sized nonbleeding AVM. Since there was no bleeding or       previous  history of bleeding and H/O thrombocytopenia, we decided to       hold off on APC.      A few small-mouthed diverticula were found in the sigmoid colon.      Non-bleeding external and internal hemorrhoids were found during       retroflexion and during perianal exam. The hemorrhoids were small.      The terminal ileum appeared normal.      The exam was otherwise without abnormality on direct and retroflexion       views. Impression:               - Three incidental colonic AVMs.                           - Mild sigmoid diverticulosis.                           - Non-bleeding external and internal hemorrhoids.                           - Otherwise normal colonoscopy to TI.                           - No specimens collected. Moderate Sedation:      Not Applicable - Patient had care per Anesthesia. Recommendation:           -  Patient has a contact number available for                            emergencies. The signs and symptoms of potential                            delayed complications were discussed with the                            patient. Return to normal activities tomorrow.                            Written discharge instructions were provided to the                            patient.                           - Resume previous diet.                           - Continue present medications.                           - Repeat colonoscopy in 10 years for screening                            purposes. Earlier, if with any new problems or                            change in family history.                           - Return to GI clinic PRN.                           - The findings and recommendations were discussed                            with the patient's son [name]. Procedure Code(s):        --- Professional ---                           P8099, Colorectal cancer screening; colonoscopy on                            individual not meeting criteria for high  risk Diagnosis Code(s):        --- Professional ---                           Z12.11, Encounter for screening for malignant                            neoplasm of colon  K55.20, Angiodysplasia of colon without hemorrhage                           K64.8, Other hemorrhoids                           K57.30, Diverticulosis of large intestine without                            perforation or abscess without bleeding CPT copyright 2019 [employer]. All rights reserved. The codes documented in this report are preliminary and upon coder review may  be revised to meet current compliance requirements. Jackquline Denmark, MD 09/24/2020 9:36:15 AM This report has been signed electronically. Number of Addenda: 0

## 2020-09-24 NOTE — Anesthesia Postprocedure Evaluation (Signed)
Anesthesia Post Note  Patient: Rebecca Jimenez  Procedure(s) Performed: ESOPHAGOGASTRODUODENOSCOPY (EGD) WITH PROPOFOL (N/A ) COLONOSCOPY WITH PROPOFOL (N/A ) BIOPSY ESOPHAGEAL BANDING     Patient location during evaluation: Endoscopy Anesthesia Type: MAC Level of consciousness: awake Pain management: pain level controlled Vital Signs Assessment: post-procedure vital signs reviewed and stable Respiratory status: spontaneous breathing Cardiovascular status: stable Postop Assessment: no apparent nausea or vomiting Anesthetic complications: no   No complications documented.  Last Vitals:  Vitals:   09/24/20 0950 09/24/20 1000  BP: (!) 161/84 (!) 180/83  Pulse: 79 80  Resp: 13 10  Temp:    SpO2: 96% 97%    Last Pain:  Vitals:   09/24/20 1000  TempSrc:   PainSc: 7                  Sofhia Ulibarri

## 2020-09-24 NOTE — Transfer of Care (Signed)
Immediate Anesthesia Transfer of Care Note  Patient: Rebecca Jimenez  Procedure(s) Performed: ESOPHAGOGASTRODUODENOSCOPY (EGD) WITH PROPOFOL (N/A ) COLONOSCOPY WITH PROPOFOL (N/A ) BIOPSY ESOPHAGEAL BANDING  Patient Location: PACU and Endoscopy Unit  Anesthesia Type:MAC  Level of Consciousness: awake and alert   Airway & Oxygen Therapy: Patient Spontanous Breathing and Patient connected to face mask oxygen  Post-op Assessment: Report given to RN and Post -op Vital signs reviewed and stable  Post vital signs: Reviewed and stable  Last Vitals:  Vitals Value Taken Time  BP 142/46 09/24/20 0932  Temp    Pulse 80 09/24/20 0933  Resp 17 09/24/20 0933  SpO2 97 % 09/24/20 0933  Vitals shown include unvalidated device data.  Last Pain:  Vitals:   09/24/20 0724  TempSrc: Oral  PainSc: 0-No pain         Complications: No complications documented.

## 2020-09-24 NOTE — Op Note (Signed)
[facility] Patient Name: Rebecca Jimenez Procedure Date: 09/24/2020 MRN: [id number] Attending MD: Jackquline Denmark , MD Date of Birth: [birth year] CSN: [id number] Age: 65 Admit Type: Outpatient Procedure:                Upper GI endoscopy Indications:              Screening procedure for esophageal varices. Patient                            with NASH cirrhosis. Intolerance to beta-blockers. Providers:                Jackquline Denmark, MD, Particia Nearing, RN, William Dalton, Technician Referring MD:              Medicines:                Monitored Anesthesia Care Complications:            No immediate complications. Estimated Blood Loss:     Estimated blood loss: none. Procedure:                Pre-Anesthesia Assessment:                           - Prior to the procedure, a History and Physical                            was performed, and patient medications and                            allergies were reviewed. The patient's tolerance of                            previous anesthesia was also reviewed. The risks                            and benefits of the procedure and the sedation                            options and risks were discussed with the patient.                            All questions were answered, and informed consent                            was obtained. Prior Anticoagulants: The patient has                            taken no previous anticoagulant or antiplatelet                            agents. ASA Grade Assessment: III - A patient with  severe systemic disease. After reviewing the risks                            and benefits, the patient was deemed in                            satisfactory condition to undergo the procedure.                           After obtaining informed consent, the endoscope was                            passed under direct vision. Throughout the                             procedure, the patient's blood pressure, pulse, and                            oxygen saturations were monitored continuously. The                            [id number]-H190 ([id number]) was introduced through the                            mouth, and advanced to the second part of duodenum.                            The upper GI endoscopy was accomplished without                            difficulty. The patient tolerated the procedure                            well. Scope In: Scope Out: Findings:      3 channels of grade III, large (> 5 mm) varices were found in the mid       esophagus and in the distal esophagus. They were 5 mm in largest       diameter. No bleeding or stigmata of recent bleeding. Five bands were       successfully placed with complete eradication, resulting in deflation of       varices. There was no bleeding during and at the end of the procedure.      Mild portal hypertensive gastropathy was noted in the body and fundus.       No fundal varices. Localized mild inflammation characterized by erythema       was found in the gastric antrum. Biopsies were taken with a cold forceps       for histology. Estimated blood loss: none.      The examined duodenum was normal. 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                            Completely eradicated. Banded.                           -  Mild portal hypertensive gastropathy. No fundal                            varices.                           - Mild gastritis. Moderate Sedation:      Not Applicable - Patient had care per Anesthesia. Recommendation:           - Patient has a contact number available for                            emergencies. The signs and symptoms of potential                            delayed complications were discussed with the                            patient. Return to normal activities tomorrow.                            Written discharge instructions  were provided to the                            patient.                           - Resume previous diet.                           - Continue present medications.                           - Await pathology results.                           - No nonsteroidals.                           - Repeat upper endoscopy with EVL in 4-6 weeks for                            retreatment.                           - The findings and recommendations were discussed                            with the patient's son Rebecca Jimenez. Procedure Code(s):        --- Professional ---                           [phone], Esophagogastroduodenoscopy, flexible,                            transoral; with band ligation of esophageal/gastric  varices                           43239, Esophagogastroduodenoscopy, flexible,                            transoral; with biopsy, single or multiple Diagnosis Code(s):        --- Professional ---                           I85.00, Esophageal varices without bleeding                           K29.70, Gastritis, unspecified, without bleeding                           Z13.810, Encounter for screening for upper                            gastrointestinal disorder CPT copyright 2019 [employer]. All rights reserved. The codes documented in this report are preliminary and upon coder review may  be revised to meet current compliance requirements. Jackquline Denmark, MD 09/24/2020 9:30:11 AM This report has been signed electronically. Number of Addenda: 0

## 2020-09-25 ENCOUNTER — Encounter (HOSPITAL_COMMUNITY): Payer: Self-pay | Admitting: Gastroenterology

## 2020-09-25 ENCOUNTER — Other Ambulatory Visit: Payer: Self-pay

## 2020-09-25 LAB — SURGICAL PATHOLOGY

## 2020-09-26 ENCOUNTER — Encounter: Payer: Self-pay | Admitting: Gastroenterology

## 2020-10-23 ENCOUNTER — Encounter: Payer: Self-pay | Admitting: Gastroenterology

## 2020-10-23 ENCOUNTER — Ambulatory Visit (HOSPITAL_BASED_OUTPATIENT_CLINIC_OR_DEPARTMENT_OTHER)
Admission: RE | Admit: 2020-10-23 | Discharge: 2020-10-23 | Disposition: A | Discharge status: Home / Self Care | Payer: Medicare PPO | Source: Ambulatory Visit | Attending: Gastroenterology | Admitting: Gastroenterology

## 2020-10-23 ENCOUNTER — Ambulatory Visit: Payer: Medicare PPO | Admitting: Gastroenterology

## 2020-10-23 ENCOUNTER — Other Ambulatory Visit: Payer: Self-pay

## 2020-10-23 ENCOUNTER — Other Ambulatory Visit (INDEPENDENT_AMBULATORY_CARE_PROVIDER_SITE_OTHER): Payer: Medicare PPO

## 2020-10-23 ENCOUNTER — Telehealth: Payer: Self-pay | Admitting: Cardiology

## 2020-10-23 VITALS — BP 134/66 | HR 87 | Ht 63.0 in | Wt 313.0 lb

## 2020-10-23 DIAGNOSIS — K7581 Nonalcoholic steatohepatitis (NASH): Secondary | ICD-10-CM | POA: Insufficient documentation

## 2020-10-23 DIAGNOSIS — R188 Other ascites: Secondary | ICD-10-CM

## 2020-10-23 DIAGNOSIS — K7460 Unspecified cirrhosis of liver: Secondary | ICD-10-CM | POA: Insufficient documentation

## 2020-10-23 DIAGNOSIS — R635 Abnormal weight gain: Secondary | ICD-10-CM

## 2020-10-23 DIAGNOSIS — D649 Anemia, unspecified: Secondary | ICD-10-CM

## 2020-10-23 LAB — CBC WITH DIFFERENTIAL/PLATELET
Basophils Absolute: 0 10*3/uL (ref 0.0–0.1)
Basophils Relative: 0.9 % (ref 0.0–3.0)
Eosinophils Absolute: 0.1 10*3/uL (ref 0.0–0.7)
Eosinophils Relative: 2.1 % (ref 0.0–5.0)
HCT: 33.9 % — ABNORMAL LOW (ref 36.0–46.0)
Hemoglobin: 11.1 g/dL — ABNORMAL LOW (ref 12.0–15.0)
Lymphocytes Relative: 20.4 % (ref 12.0–46.0)
Lymphs Abs: 0.5 10*3/uL — ABNORMAL LOW (ref 0.7–4.0)
MCHC: 32.9 g/dL (ref 30.0–36.0)
MCV: 93.6 fl (ref 78.0–100.0)
Monocytes Absolute: 0.2 10*3/uL (ref 0.1–1.0)
Monocytes Relative: 8.7 % (ref 3.0–12.0)
Neutro Abs: 1.8 10*3/uL (ref 1.4–7.7)
Neutrophils Relative %: 67.9 % (ref 43.0–77.0)
Platelets: 38 10*3/uL — CL (ref 150.0–400.0)
RBC: 3.62 Mil/uL — ABNORMAL LOW (ref 3.87–5.11)
RDW: 15.8 % — ABNORMAL HIGH (ref 11.5–15.5)
WBC: 2.7 10*3/uL — ABNORMAL LOW (ref 4.0–10.5)

## 2020-10-23 LAB — COMPREHENSIVE METABOLIC PANEL
ALT: 24 U/L (ref 0–35)
AST: 31 U/L (ref 0–37)
Albumin: 3.5 g/dL (ref 3.5–5.2)
Alkaline Phosphatase: 187 U/L — ABNORMAL HIGH (ref 39–117)
BUN: 14 mg/dL (ref 6–23)
CO2: 30 mEq/L (ref 19–32)
Calcium: 9 mg/dL (ref 8.4–10.5)
Chloride: 108 mEq/L (ref 96–112)
Creatinine, Ser: 1.08 mg/dL (ref 0.40–1.20)
GFR: 55.19 mL/min — ABNORMAL LOW (ref >60.00)
Glucose, Bld: 120 mg/dL — ABNORMAL HIGH (ref 70–99)
Potassium: 4 mEq/L (ref 3.5–5.1)
Sodium: 143 mEq/L (ref 135–145)
Total Bilirubin: 1.5 mg/dL — ABNORMAL HIGH (ref 0.2–1.2)
Total Protein: 6.2 g/dL (ref 6.0–8.3)

## 2020-10-23 LAB — PROTIME-INR
INR: 1.2 ratio — ABNORMAL HIGH (ref 0.8–1.0)
Prothrombin Time: 13.3 s — ABNORMAL HIGH (ref 9.6–13.1)

## 2020-10-23 MED ORDER — SPIRONOLACTONE 50 MG PO TABS: ORAL_TABLET | ORAL | 3 refills | Status: DC

## 2020-10-23 MED ORDER — FUROSEMIDE 80 MG PO TABS: 80.0000 mg | ORAL_TABLET | Freq: Two times a day (BID) | ORAL | 3 refills | Status: DC

## 2020-10-23 NOTE — Telephone Encounter (Signed)
Spoke with patient. Patient was seen by Dr. Lyndel Safe today and it was noted she gained 24lbs in a month. She has SOB with activity and rolling over in bed. She has edema in her legs, feet and stomach. Patient has not been tracking her weight daily. Dr. Lyndel Safe told patient to weigh daily, restrict sodium, restrict fluid to 1/2 gallon daily, increase lasix to 23m BID, increase spironolactone to 545mdaily with instructions on when to increase to 10074maily, and to call cardiology for a follow up appointment. A CBC and CMP was done today. Dr. GupLyndel Safe call patient back in a few days to check weight. Patient advised to call back if she gains 3lbs overnight or 5lbs in a week. Advised patient message will be sent to Dr. JorMartiniqued if any further recommendations she will be called back. Next available APP appointment made for 11/08/20 with JesDenyse AmassP. No openings with Dr. JorMartinique this time.

## 2020-10-23 NOTE — Telephone Encounter (Signed)
Advised patient, verbalized understanding  
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
## 2020-10-23 NOTE — Telephone Encounter (Signed)
Agree with recommendations per Dr Lyndel Safe. I suspect a lot of her edema is more related to cirrhosis. Follow up with Denyse Amass is appropriate.  Jasminne Mealy Martinique MD, [facility]

## 2020-10-23 NOTE — Patient Instructions (Addendum)
If you are age 63 or older, your body mass index should be between 23-30. Your Body mass index is 55.45 kg/m. If this is out of the aforementioned range listed, please consider follow up with your Primary Care Provider.  If you are age 79 or younger, your body mass index should be between 19-25. Your Body mass index is 55.45 kg/m. If this is out of the aformentioned range listed, please consider follow up with your Primary Care Provider.   We have sent the following medications to your pharmacy for you to pick up at your convenience: Increase Lasix 80 mg twice daily Aldactone 50 mg daily. Watch blood pressure and weight. In 3 days if no significant weight loss, Increase to 162m once daily.    [facility] [address] [facility] on ---at ---. Please arrive 15 minutes prior to your appointment for registration. Make certain not to have anything to eat or drink 6 hours prior to your appointment. Should you need to reschedule your appointment, please contact radiology at [phone] This test typically takes about 30 minutes to perform.   Please go to the lab on the 2nd floor [address] before you leave the office today. You will need to come back in 2 weeks to have repeat labs, please schedule that while you are there as the lab requires an appointment.   Please stop by Radiology on the 1 st floor to have your Chest X ray before you leave today. Please schedule you ultrasound while you are there.   Check weight daily  It has been recommended to you by your physician that you have a(n) EGD completed. We did not schedule the procedure(s) today. We will contact you with information regarding this procedure appointment.   Continue with strict salt restriction.  Make an appointment with Cardiology  Thank you,  Dr. RJackquline Denmark

## 2020-10-23 NOTE — Telephone Encounter (Signed)
Pt c/o swelling: STAT is pt has developed SOB within 24 hours  1) How much weight have you gained and in what time span? 24 pounds in 1 month.  2) If swelling, where is the swelling located? Legs and stomach.  3) Are you currently taking a fluid pill? Yes.  4) Are you currently SOB? Yes, developed a week ago.  5) Do you have a log of your daily weights (if so, list)? No.  6) Have you gained 3 pounds in a day or 5 pounds in a week? No.  7) Have you traveled recently? No.   Patient states that she is having slight SOB and a unusual amount of fluid in her legs and stomach area, she states that she would like to be seen by Dr. Martinique within the next week. Please advise.

## 2020-10-28 ENCOUNTER — Ambulatory Visit (HOSPITAL_BASED_OUTPATIENT_CLINIC_OR_DEPARTMENT_OTHER)
Admission: RE | Admit: 2020-10-28 | Discharge: 2020-10-28 | Disposition: A | Discharge status: Home / Self Care | Payer: Medicare PPO | Source: Ambulatory Visit | Attending: Gastroenterology | Admitting: Gastroenterology

## 2020-10-28 ENCOUNTER — Other Ambulatory Visit: Payer: Self-pay

## 2020-10-28 DIAGNOSIS — K7460 Unspecified cirrhosis of liver: Secondary | ICD-10-CM | POA: Diagnosis present

## 2020-10-28 DIAGNOSIS — R635 Abnormal weight gain: Secondary | ICD-10-CM | POA: Diagnosis present

## 2020-10-28 DIAGNOSIS — K7581 Nonalcoholic steatohepatitis (NASH): Secondary | ICD-10-CM | POA: Diagnosis not present

## 2020-10-28 DIAGNOSIS — R188 Other ascites: Secondary | ICD-10-CM | POA: Diagnosis present

## 2020-10-30 ENCOUNTER — Telehealth: Payer: Self-pay | Admitting: General Surgery

## 2020-10-30 NOTE — Telephone Encounter (Signed)
-----   Message from Jackquline Denmark, MD sent at 10/29/2020  8:37 PM EST ----- Please inform the patient. [place]- cirrhosis, splenomegaly. Minimal ascites - diuretics working No [place] Send report to family physician

## 2020-10-30 NOTE — Telephone Encounter (Signed)
Contacted the patient to give her results to [place]. LVM to call office back. Routed [place] to PCP.

## 2020-11-06 ENCOUNTER — Other Ambulatory Visit (INDEPENDENT_AMBULATORY_CARE_PROVIDER_SITE_OTHER): Payer: Medicare PPO

## 2020-11-06 ENCOUNTER — Telehealth: Payer: Self-pay | Admitting: Gastroenterology

## 2020-11-06 ENCOUNTER — Other Ambulatory Visit: Payer: Self-pay

## 2020-11-06 DIAGNOSIS — K7581 Nonalcoholic steatohepatitis (NASH): Secondary | ICD-10-CM | POA: Diagnosis not present

## 2020-11-06 DIAGNOSIS — R635 Abnormal weight gain: Secondary | ICD-10-CM

## 2020-11-06 DIAGNOSIS — R188 Other ascites: Secondary | ICD-10-CM | POA: Diagnosis not present

## 2020-11-06 DIAGNOSIS — K7460 Unspecified cirrhosis of liver: Secondary | ICD-10-CM

## 2020-11-06 DIAGNOSIS — D649 Anemia, unspecified: Secondary | ICD-10-CM | POA: Diagnosis not present

## 2020-11-06 LAB — COMPREHENSIVE METABOLIC PANEL
ALT: 20 U/L (ref 0–35)
AST: 30 U/L (ref 0–37)
Albumin: 3.8 g/dL (ref 3.5–5.2)
Alkaline Phosphatase: 161 U/L — ABNORMAL HIGH (ref 39–117)
BUN: 20 mg/dL (ref 6–23)
CO2: 33 mEq/L — ABNORMAL HIGH (ref 19–32)
Calcium: 9.3 mg/dL (ref 8.4–10.5)
Chloride: 103 mEq/L (ref 96–112)
Creatinine, Ser: 1.17 mg/dL (ref 0.40–1.20)
GFR: 50.13 mL/min — ABNORMAL LOW (ref >60.00)
Glucose, Bld: 135 mg/dL — ABNORMAL HIGH (ref 70–99)
Potassium: 3.6 mEq/L (ref 3.5–5.1)
Sodium: 142 mEq/L (ref 135–145)
Total Bilirubin: 1.6 mg/dL — ABNORMAL HIGH (ref 0.2–1.2)
Total Protein: 7.1 g/dL (ref 6.0–8.3)

## 2020-11-06 LAB — CBC WITH DIFFERENTIAL/PLATELET
Basophils Absolute: 0 10*3/uL (ref 0.0–0.1)
Basophils Relative: 0.6 % (ref 0.0–3.0)
Eosinophils Absolute: 0.1 10*3/uL (ref 0.0–0.7)
Eosinophils Relative: 1.9 % (ref 0.0–5.0)
HCT: 36.1 % (ref 36.0–46.0)
Hemoglobin: 11.9 g/dL — ABNORMAL LOW (ref 12.0–15.0)
Lymphocytes Relative: 21.2 % (ref 12.0–46.0)
Lymphs Abs: 0.7 10*3/uL (ref 0.7–4.0)
MCHC: 33 g/dL (ref 30.0–36.0)
MCV: 92.7 fl (ref 78.0–100.0)
Monocytes Absolute: 0.3 10*3/uL (ref 0.1–1.0)
Monocytes Relative: 8 % (ref 3.0–12.0)
Neutro Abs: 2.2 10*3/uL (ref 1.4–7.7)
Neutrophils Relative %: 68.3 % (ref 43.0–77.0)
Platelets: 46 10*3/uL — CL (ref 150.0–400.0)
RBC: 3.89 Mil/uL (ref 3.87–5.11)
RDW: 14.8 % (ref 11.5–15.5)
WBC: 3.2 10*3/uL — ABNORMAL LOW (ref 4.0–10.5)

## 2020-11-06 LAB — PROTIME-INR
INR: 1.1 ratio — ABNORMAL HIGH (ref 0.8–1.0)
Prothrombin Time: 12.6 s (ref 9.6–13.1)

## 2020-11-06 NOTE — Telephone Encounter (Signed)
Pt is requesting a call back please.

## 2020-11-07 NOTE — Telephone Encounter (Signed)
Great news Lets cut down Lasix to 80 QAM and 40 mg QPM (as before) She does need to get her labs-CBC, CMP when she sees cardiology tomorrow  RG
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
## 2020-11-07 NOTE — Telephone Encounter (Signed)
Spoke to patient to inform her of Dr Steve Rattler recommendations for her Lasix. She will take Lasix 80 mg qam and 40 mg qpm. She will have labs done at her cardiologist on 11/08/20 all questions answered. Patient voiced understanding.

## 2020-11-07 NOTE — Telephone Encounter (Signed)
Spoke to patient this morning who was in office on 10/23/20 for weight gain of 24 lb over a month. She Increased lasix 10m po bid and Increase aldactone 50 mg po qd  if no sig wt loss, increase to daily. Patient reports a 28 pound weight loss since this appointment. She has a follow up with cardiology on 11/08/20. [facility] I let her know that I will notify Dr GLyndel Safeof this request to make sure there was no further evaluation needed to proceed safely for this procedure.

## 2020-11-08 ENCOUNTER — Other Ambulatory Visit: Payer: Self-pay

## 2020-11-08 ENCOUNTER — Encounter: Payer: Self-pay | Admitting: General Practice

## 2020-11-08 ENCOUNTER — Ambulatory Visit: Payer: Medicare PPO | Admitting: General Practice

## 2020-11-08 VITALS — BP 136/62 | HR 76 | Ht 63.0 in | Wt 286.0 lb

## 2020-11-08 DIAGNOSIS — E119 Type 2 diabetes mellitus without complications: Secondary | ICD-10-CM

## 2020-11-08 DIAGNOSIS — K922 Gastrointestinal hemorrhage, unspecified: Secondary | ICD-10-CM

## 2020-11-08 DIAGNOSIS — I428 Other cardiomyopathies: Secondary | ICD-10-CM

## 2020-11-08 DIAGNOSIS — R001 Bradycardia, unspecified: Secondary | ICD-10-CM

## 2020-11-08 DIAGNOSIS — I10 Essential (primary) hypertension: Secondary | ICD-10-CM | POA: Diagnosis not present

## 2020-11-08 DIAGNOSIS — K7581 Nonalcoholic steatohepatitis (NASH): Secondary | ICD-10-CM | POA: Diagnosis not present

## 2020-11-08 DIAGNOSIS — K7460 Unspecified cirrhosis of liver: Secondary | ICD-10-CM

## 2020-11-08 DIAGNOSIS — Z79899 Other long term (current) drug therapy: Secondary | ICD-10-CM

## 2020-11-08 LAB — BASIC METABOLIC PANEL
BUN/Creatinine Ratio: 15 (ref 12–28)
BUN: 19 mg/dL (ref 8–27)
CO2: 25 mmol/L (ref 20–29)
Calcium: 9.5 mg/dL (ref 8.7–10.3)
Chloride: 104 mmol/L (ref 96–106)
Creatinine, Ser: 1.27 mg/dL — ABNORMAL HIGH (ref 0.57–1.00)
GFR calc Af Amer: 52 mL/min/{1.73_m2} — ABNORMAL LOW (ref >59)
GFR calc non Af Amer: 45 mL/min/{1.73_m2} — ABNORMAL LOW (ref >59)
Glucose: 115 mg/dL — ABNORMAL HIGH (ref 65–99)
Potassium: 4.2 mmol/L (ref 3.5–5.2)
Sodium: 143 mmol/L (ref 134–144)

## 2020-11-08 NOTE — Patient Instructions (Signed)
Medication Instructions:  The current medical regimen is effective;  continue present plan and medications as directed. Please refer to the Current Medication list given to you today.  *If you need a refill on your cardiac medications before your next appointment, please call your pharmacy*  Lab Work: BMET TODAY If you have labs (blood work) drawn today and your tests are completely normal, you will receive your results only by:  [place] (if you have MyChart) OR A paper copy in the mail.  If you have any lab test that is abnormal or we need to change your treatment, we will call you to review the results. You may go to any [facility] that is convenient for you however, we do have a lab in our office that is able to assist you. You DO NOT need an appointment for our lab. The lab is open 8:00am and closes at 4:00pm. Lunch 12:45 - 1:45pm.  Special Instructions PLEASE READ AND FOLLOW SALTY 6-ATTACHED-1,800 mg daily  Follow-Up: Your next appointment:  3 month(s) In Person with [name], MD OR IF UNAVAILABLE [place], FNP-C  At [facility], you and your health needs are our priority.  As part of our continuing mission to provide you with exceptional heart care, we have created designated Provider Care Teams.  These Care Teams include your primary Cardiologist (physician) and Advanced Practice Providers (APPs -  Physician Assistants and Nurse Practitioners) who all work together to provide you with the care you need, when you need it.

## 2020-11-12 NOTE — Telephone Encounter (Signed)
Pt states her labs have been completed at her Cardiology and results are in her chart.

## 2020-11-12 NOTE — Telephone Encounter (Signed)
Dr Lyndel Safe please review 11/08/20 cardiology note and labs. She would like to schedule EGD at [facility]

## 2020-11-12 NOTE — Telephone Encounter (Signed)
Wt Readings from Last 3 Encounters:  11/08/20 286 lb (129.7 kg)  10/23/20 (!) 313 lb (142 kg)  09/24/20 289 lb 14.5 oz (131.5 kg)   Reviewed above weight Reviewed cardio note and labs (Cr1.2) She is doing well with salt restriction.  Pl tell her to check wt daily and record. Lets decrease lasix 15m po BID Pt to increase lasix if there is wt gain>4lb (to prev dose 80 QAM, 40 QPM) [facility] Recheck CBC, BMP in 4 weeks.  RG

## 2020-11-14 ENCOUNTER — Other Ambulatory Visit: Payer: Self-pay | Admitting: Gastroenterology

## 2020-11-14 DIAGNOSIS — I8500 Esophageal varices without bleeding: Secondary | ICD-10-CM

## 2020-11-14 DIAGNOSIS — K7460 Unspecified cirrhosis of liver: Secondary | ICD-10-CM

## 2020-11-14 DIAGNOSIS — D649 Anemia, unspecified: Secondary | ICD-10-CM

## 2020-11-14 NOTE — Telephone Encounter (Signed)
Spoke to patient this morning to inform her of Dr Steve Rattler recommendations. She will monitor her weight and take her medication as discussed. She is scheduled for and EGD at [facility] hospital 12/24/20. She will have labs done in 4 weeks. All questions answered. Patient voiced understanding

## 2020-12-19 NOTE — Progress Notes (Signed)
Attempted to obtain medical history via telephone, unable to reach at this time. I left a voicemail to return pre surgical testing department's phone call.  

## 2020-12-20 ENCOUNTER — Other Ambulatory Visit (HOSPITAL_COMMUNITY)
Admission: RE | Admit: 2020-12-20 | Discharge: 2020-12-20 | Disposition: A | Discharge status: Home / Self Care | Payer: Medicare PPO | Source: Ambulatory Visit | Attending: Gastroenterology | Admitting: Gastroenterology

## 2020-12-20 DIAGNOSIS — Z20822 Contact with and (suspected) exposure to covid-19: Secondary | ICD-10-CM | POA: Diagnosis not present

## 2020-12-20 DIAGNOSIS — Z01812 Encounter for preprocedural laboratory examination: Secondary | ICD-10-CM | POA: Insufficient documentation

## 2020-12-20 LAB — SARS CORONAVIRUS 2 (TAT 6-24 HRS): SARS Coronavirus 2: NEGATIVE

## 2020-12-24 ENCOUNTER — Encounter (HOSPITAL_COMMUNITY): Payer: Self-pay | Admitting: Gastroenterology

## 2020-12-24 ENCOUNTER — Ambulatory Visit (HOSPITAL_COMMUNITY)
Admission: RE | Admit: 2020-12-24 | Discharge: 2020-12-24 | Disposition: A | Discharge status: Home / Self Care | Payer: Medicare PPO | Source: Ambulatory Visit | Attending: Gastroenterology | Admitting: Gastroenterology

## 2020-12-24 ENCOUNTER — Ambulatory Visit (HOSPITAL_COMMUNITY): Payer: Medicare PPO | Admitting: Anesthesiology

## 2020-12-24 ENCOUNTER — Other Ambulatory Visit: Payer: Self-pay

## 2020-12-24 ENCOUNTER — Encounter (HOSPITAL_COMMUNITY)
Admission: RE | Disposition: A | Discharge status: Home / Self Care | Payer: Self-pay | Source: Ambulatory Visit | Attending: Gastroenterology

## 2020-12-24 DIAGNOSIS — I8510 Secondary esophageal varices without bleeding: Secondary | ICD-10-CM | POA: Insufficient documentation

## 2020-12-24 DIAGNOSIS — Z882 Allergy status to sulfonamides status: Secondary | ICD-10-CM | POA: Diagnosis not present

## 2020-12-24 DIAGNOSIS — Z13810 Encounter for screening for upper gastrointestinal disorder: Secondary | ICD-10-CM | POA: Insufficient documentation

## 2020-12-24 DIAGNOSIS — E669 Obesity, unspecified: Secondary | ICD-10-CM | POA: Diagnosis not present

## 2020-12-24 DIAGNOSIS — Z885 Allergy status to narcotic agent status: Secondary | ICD-10-CM | POA: Diagnosis not present

## 2020-12-24 DIAGNOSIS — I130 Hypertensive heart and chronic kidney disease with heart failure and stage 1 through stage 4 chronic kidney disease, or unspecified chronic kidney disease: Secondary | ICD-10-CM | POA: Insufficient documentation

## 2020-12-24 DIAGNOSIS — Z7984 Long term (current) use of oral hypoglycemic drugs: Secondary | ICD-10-CM | POA: Diagnosis not present

## 2020-12-24 DIAGNOSIS — K3189 Other diseases of stomach and duodenum: Secondary | ICD-10-CM | POA: Diagnosis not present

## 2020-12-24 DIAGNOSIS — Z6841 Body Mass Index (BMI) 40.0 and over, adult: Secondary | ICD-10-CM | POA: Insufficient documentation

## 2020-12-24 DIAGNOSIS — Z8673 Personal history of transient ischemic attack (TIA), and cerebral infarction without residual deficits: Secondary | ICD-10-CM | POA: Insufficient documentation

## 2020-12-24 DIAGNOSIS — I428 Other cardiomyopathies: Secondary | ICD-10-CM | POA: Insufficient documentation

## 2020-12-24 DIAGNOSIS — E785 Hyperlipidemia, unspecified: Secondary | ICD-10-CM | POA: Diagnosis not present

## 2020-12-24 DIAGNOSIS — M3500 Sicca syndrome, unspecified: Secondary | ICD-10-CM | POA: Diagnosis not present

## 2020-12-24 DIAGNOSIS — E1122 Type 2 diabetes mellitus with diabetic chronic kidney disease: Secondary | ICD-10-CM | POA: Insufficient documentation

## 2020-12-24 DIAGNOSIS — Z87891 Personal history of nicotine dependence: Secondary | ICD-10-CM | POA: Insufficient documentation

## 2020-12-24 DIAGNOSIS — Z90710 Acquired absence of both cervix and uterus: Secondary | ICD-10-CM | POA: Insufficient documentation

## 2020-12-24 DIAGNOSIS — G4733 Obstructive sleep apnea (adult) (pediatric): Secondary | ICD-10-CM | POA: Insufficient documentation

## 2020-12-24 DIAGNOSIS — K7581 Nonalcoholic steatohepatitis (NASH): Secondary | ICD-10-CM | POA: Insufficient documentation

## 2020-12-24 DIAGNOSIS — Z833 Family history of diabetes mellitus: Secondary | ICD-10-CM | POA: Insufficient documentation

## 2020-12-24 DIAGNOSIS — Z9049 Acquired absence of other specified parts of digestive tract: Secondary | ICD-10-CM | POA: Insufficient documentation

## 2020-12-24 DIAGNOSIS — N189 Chronic kidney disease, unspecified: Secondary | ICD-10-CM | POA: Diagnosis not present

## 2020-12-24 DIAGNOSIS — J449 Chronic obstructive pulmonary disease, unspecified: Secondary | ICD-10-CM | POA: Insufficient documentation

## 2020-12-24 DIAGNOSIS — I5042 Chronic combined systolic (congestive) and diastolic (congestive) heart failure: Secondary | ICD-10-CM | POA: Diagnosis not present

## 2020-12-24 DIAGNOSIS — Z8616 Personal history of COVID-19: Secondary | ICD-10-CM | POA: Insufficient documentation

## 2020-12-24 DIAGNOSIS — K7460 Unspecified cirrhosis of liver: Secondary | ICD-10-CM | POA: Diagnosis not present

## 2020-12-24 DIAGNOSIS — Z79899 Other long term (current) drug therapy: Secondary | ICD-10-CM | POA: Diagnosis not present

## 2020-12-24 DIAGNOSIS — K766 Portal hypertension: Secondary | ICD-10-CM | POA: Insufficient documentation

## 2020-12-24 DIAGNOSIS — Z8249 Family history of ischemic heart disease and other diseases of the circulatory system: Secondary | ICD-10-CM | POA: Insufficient documentation

## 2020-12-24 DIAGNOSIS — K2970 Gastritis, unspecified, without bleeding: Secondary | ICD-10-CM | POA: Diagnosis not present

## 2020-12-24 DIAGNOSIS — Z881 Allergy status to other antibiotic agents status: Secondary | ICD-10-CM | POA: Diagnosis not present

## 2020-12-24 DIAGNOSIS — K219 Gastro-esophageal reflux disease without esophagitis: Secondary | ICD-10-CM

## 2020-12-24 DIAGNOSIS — Z880 Allergy status to penicillin: Secondary | ICD-10-CM | POA: Insufficient documentation

## 2020-12-24 HISTORY — PX: BIOPSY: SHX5522

## 2020-12-24 HISTORY — PX: ESOPHAGEAL BANDING: SHX5518

## 2020-12-24 HISTORY — PX: ESOPHAGOGASTRODUODENOSCOPY (EGD) WITH PROPOFOL: SHX5813

## 2020-12-24 LAB — GLUCOSE, CAPILLARY
Glucose-Capillary: 165 mg/dL — ABNORMAL HIGH (ref 70–99)
Glucose-Capillary: 141 mg/dL — ABNORMAL HIGH (ref 70–99)

## 2020-12-24 SURGERY — ESOPHAGOGASTRODUODENOSCOPY (EGD) WITH PROPOFOL
Anesthesia: Monitor Anesthesia Care

## 2020-12-24 MED ORDER — LIDOCAINE 2% (20 MG/ML) 5 ML SYRINGE
INTRAMUSCULAR | Status: DC | PRN
  Administered 2020-12-24: 80 mg via INTRAVENOUS

## 2020-12-24 MED ORDER — LACTATED RINGERS IV SOLN: INTRAVENOUS | Status: DC

## 2020-12-24 MED ORDER — PROPOFOL 10 MG/ML IV BOLUS
INTRAVENOUS | Status: DC | PRN
  Administered 2020-12-24 (×2): 20 mg via INTRAVENOUS

## 2020-12-24 MED ORDER — PROPOFOL 500 MG/50ML IV EMUL
INTRAVENOUS | Status: DC | PRN
  Administered 2020-12-24: 125 ug/kg/min via INTRAVENOUS

## 2020-12-24 MED ORDER — PROPOFOL 500 MG/50ML IV EMUL
INTRAVENOUS | Status: AC
  Filled 2020-12-24: qty 50

## 2020-12-24 MED ORDER — PANTOPRAZOLE SODIUM 40 MG PO TBEC: 40.0000 mg | DELAYED_RELEASE_TABLET | Freq: Every day | ORAL | 3 refills | Status: DC

## 2020-12-24 SURGICAL SUPPLY — 14 items

## 2020-12-24 NOTE — Progress Notes (Signed)
Pt up to chair at bedside. Getting dressed on her own. Sts chest discomfort continues to improve. Denies n/v/dizziness. No SHOB. Skin w/d/pink. NAD. VSS. Pt ready to be discharged to home.
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
## 2020-12-24 NOTE — Progress Notes (Signed)
Pt sts chest discomfort improving. Sts she had same discomfort in January after last procedure. Sts lasted for 4 days at that time. Currently skin remains pink, warm, dry. Resp wnl, equal and non-labored. A/Ox3. NAD. VSS. Cont to monitor.

## 2020-12-24 NOTE — Anesthesia Postprocedure Evaluation (Signed)
Anesthesia Post Note  Patient: [name]  Procedure(s) Performed: ESOPHAGOGASTRODUODENOSCOPY (EGD) WITH PROPOFOL (N/A ) ESOPHAGEAL BANDING BIOPSY     Patient location during evaluation: PACU Anesthesia Type: MAC Level of consciousness: awake and alert Pain management: pain level controlled Vital Signs Assessment: post-procedure vital signs reviewed and stable Respiratory status: spontaneous breathing, nonlabored ventilation and respiratory function stable Cardiovascular status: blood pressure returned to baseline and stable Postop Assessment: no apparent nausea or vomiting Anesthetic complications: no Comments: C/o same substernal chest pain she normally has after these bandings. Normal EKG   No complications documented.  Last Vitals:  Vitals:   12/24/20 0745 12/24/20 0904  BP: (!) 161/56 (!) 151/53  Pulse:  80  Resp: 20 18  Temp: 36.7 C 37.1 C  SpO2: 100% 100%    Last Pain:  Vitals:   12/24/20 0904  TempSrc: Oral  PainSc: [place]

## 2020-12-24 NOTE — Transfer of Care (Signed)
Immediate Anesthesia Transfer of Care Note  Patient: Rebecca Jimenez  Procedure(s) Performed: ESOPHAGOGASTRODUODENOSCOPY (EGD) WITH PROPOFOL (N/A ) ESOPHAGEAL BANDING BIOPSY  Patient Location: PACU  Anesthesia Type:MAC  Level of Consciousness: awake, alert  and oriented  Airway & Oxygen Therapy: Patient Spontanous Breathing and Patient connected to face mask oxygen  Post-op Assessment: Report given to RN and Post -op Vital signs reviewed and stable  Post vital signs: Reviewed and stable  Last Vitals:  Vitals Value Taken Time  BP    Temp    Pulse 86 12/24/20 0904  Resp 13 12/24/20 0904  SpO2 100 % 12/24/20 0904  Vitals shown include unvalidated device data.  Last Pain:  Vitals:   12/24/20 0745  TempSrc: Oral  PainSc: 0-No pain         Complications: No complications documented.
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
## 2020-12-24 NOTE — Progress Notes (Signed)
Pt A/Ox4. C/O 8/10 midsternal chest pressure radiating to neck upon wakening. Pt sts she did not have discomfort prior to precdure. Per CRNA pt in Bigeminy during procedure. Pt remains in Bigeminy in recovery. Skin w/d/pink. Resp wnl, equal and non-labored. No n/v. VSS. Dr. Doroteo Glassman made aware and arrives at bedside while bedside EKG performed. Dr. Doroteo Glassman views EKG. No further orders given at this time. Cont to monitor pt.

## 2020-12-24 NOTE — Op Note (Signed)
Rebecca Jimenez Patient Name: Rebecca Jimenez Procedure Date: 12/24/2020 MRN: [id number] Attending MD: Jackquline Denmark , MD Date of Birth: [birth year] CSN: [id number] Age: 65 Admit Type: Outpatient Procedure:                Upper GI endoscopy Indications:              Screening procedure- H/O esophageal varices s/p EVL                            09/24/2020. Patient with NASH cirrhosis, intolerant                            to beta-blockers Providers:                Jackquline Denmark, MD, Carmie End, RN, Brooke                            Person, Tyrone Apple, Technician, [facility], CRNA Referring MD:              Medicines:                Monitored Anesthesia Care Complications:            No immediate complications. Estimated Blood Loss:     Estimated blood loss: none. Procedure:                Pre-Anesthesia Assessment:                           - Prior to the procedure, a History and Physical                            was performed, and patient medications and                            allergies were reviewed. The patient's tolerance of                            previous anesthesia was also reviewed. The risks                            and benefits of the procedure and the sedation                            options and risks were discussed with the patient.                            All questions were answered, and informed consent                            was obtained. Prior Anticoagulants: The patient has                            taken no previous anticoagulant  or antiplatelet                            agents. ASA Grade Assessment: IV - A patient with                            severe systemic disease that is a constant threat                            to life. After reviewing the risks and benefits,                            the patient was deemed in satisfactory condition to                            undergo the  procedure.                           After obtaining informed consent, the endoscope was                            passed under direct vision. Throughout the                            procedure, the patient's blood pressure, pulse, and                            oxygen saturations were monitored continuously. The                            GIF-H190 ([id number]) Olympus gastroscope was                            introduced through the mouth, and advanced to the                            second part of duodenum. The upper GI endoscopy was                            accomplished without difficulty. The patient                            tolerated the procedure well. Scope In: Scope Out: Findings:      3 channels of grade III, large (> 5 mm) varices were found in the lower       third of the esophagus. They were 5-6 mm in largest diameter. Five bands       were successfully placed with complete eradication, resulting in       deflation of varices. There was no bleeding during and at the end of the       procedure.      Scattered mild inflammation characterized by erythema s/o mild portal       hypertensive gastropathy was found in the entire examined stomach.       Biopsies were taken with a cold forceps  for histology. There was some       retained food in the stomach. No outlet obstruction. The retroflexed       examination of the cardia did not reveal any fundal varices.      The examined duodenum was normal. 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                            Completely eradicated. Banded.                           - Mild portal hypertensive gastropathy. Moderate Sedation:      Not Applicable - Patient had care per Anesthesia. Recommendation:           - Patient has a contact number available for                            emergencies. The signs and symptoms of potential                            delayed complications were discussed  with the                            patient. Return to normal activities tomorrow.                            Written discharge instructions were provided to the                            patient.                           - Resume previous diet.                           - Continue present medications.                           - Await pathology results.                           - No aspirin, ibuprofen, naproxen, or other                            non-steroidal anti-inflammatory drugs.                           - The findings and recommendations were discussed                            with the patient's son Rebecca Jimenez. He understands                            progressive nature of the problem.                           -  FU in GI clinic in 3 to 4 weeks. Procedure Code(s):        --- Professional ---                           [phone], Esophagogastroduodenoscopy, flexible,                            transoral; with band ligation of esophageal/gastric                            varices                           43239, Esophagogastroduodenoscopy, flexible,                            transoral; with biopsy, single or multiple Diagnosis Code(s):        --- Professional ---                           I85.00, Esophageal varices without bleeding                           K29.70, Gastritis, unspecified, without bleeding                           Z13.810, Encounter for screening for upper                            gastrointestinal disorder CPT copyright 2019 [employer]. All rights reserved. The codes documented in this report are preliminary and upon coder review may  be revised to meet current compliance requirements. Jackquline Denmark, MD 12/24/2020 9:01:47 AM This report has been signed electronically. Number of Addenda: 0

## 2020-12-24 NOTE — Discharge Instructions (Signed)
SOFT DIET TODAY. YOU MAY RESUME NORMAL DIET TOMORROW.  YOU HAD AN ENDOSCOPIC PROCEDURE TODAY: Refer to the procedure report and other information in the discharge instructions given to you for any specific questions about what was found during the examination. If this information does not answer your questions, please call [place] office at [phone] to clarify.   YOU SHOULD EXPECT: Some feelings of bloating in the abdomen. Passage of more gas than usual. Walking can help get rid of the air that was put into your GI tract during the procedure and reduce the bloating. If you had a lower endoscopy (such as a colonoscopy or flexible sigmoidoscopy) you may notice spotting of blood in your stool or on the toilet paper. Some abdominal soreness may be present for a day or two, also.  DIET: Your first meal following the procedure should be a light meal and then it is ok to progress to your normal diet. A half-sandwich or bowl of soup is an example of a good first meal. Heavy or fried foods are harder to digest and may make you feel nauseous or bloated. Drink plenty of fluids but you should avoid alcoholic beverages for 24 hours. If you had a esophageal dilation, please see attached instructions for diet.    ACTIVITY: Your care partner should take you home directly after the procedure. You should plan to take it easy, moving slowly for the rest of the day. You can resume normal activity the day after the procedure however YOU SHOULD NOT DRIVE, use power tools, machinery or perform tasks that involve climbing or major physical exertion for 24 hours (because of the sedation medicines used during the test).   SYMPTOMS TO REPORT IMMEDIATELY: A gastroenterologist can be reached at any hour. Please call [phone]  for any of the following symptoms:  Following upper endoscopy (EGD, EUS, ERCP, esophageal dilation) Vomiting of blood or coffee ground material  New, significant abdominal pain  New, significant  chest pain or pain under the shoulder blades  Painful or persistently difficult swallowing  New shortness of breath  Black, tarry-looking or red, bloody stools  FOLLOW UP:  If any biopsies were taken you will be contacted by phone or by letter within the next 1-3 weeks. Call [phone]  if you have not heard about the biopsies in 3 weeks.  Please also call with any specific questions about appointments or follow up tests.

## 2020-12-25 ENCOUNTER — Encounter (HOSPITAL_COMMUNITY): Payer: Self-pay | Admitting: Gastroenterology

## 2020-12-25 LAB — SURGICAL PATHOLOGY

## 2020-12-29 ENCOUNTER — Encounter: Payer: Self-pay | Admitting: Gastroenterology

## 2021-01-10 ENCOUNTER — Ambulatory Visit: Admit: 2021-01-10 | Discharge: 2021-01-11 | Payer: MEDICARE

## 2021-01-10 DIAGNOSIS — K7460 Unspecified cirrhosis of liver: Principal | ICD-10-CM

## 2021-01-10 DIAGNOSIS — D696 Thrombocytopenia, unspecified: Principal | ICD-10-CM

## 2021-01-10 DIAGNOSIS — E1142 Type 2 diabetes mellitus with diabetic polyneuropathy: Principal | ICD-10-CM

## 2021-01-10 DIAGNOSIS — K862 Cyst of pancreas: Principal | ICD-10-CM

## 2021-01-10 DIAGNOSIS — Z794 Long term (current) use of insulin: Principal | ICD-10-CM

## 2021-01-10 DIAGNOSIS — R161 Splenomegaly, not elsewhere classified: Principal | ICD-10-CM

## 2021-01-10 DIAGNOSIS — K7581 Nonalcoholic steatohepatitis (NASH): Principal | ICD-10-CM

## 2021-01-10 MED ORDER — LACTULOSE 20 GRAM/30 ML ORAL SOLUTION
Freq: Every day | ORAL | 11 refills | 33 days | Status: CP
Start: 2021-01-10 — End: ?

## 2021-01-29 ENCOUNTER — Other Ambulatory Visit: Payer: Self-pay | Admitting: Transplant Hepatology

## 2021-01-29 DIAGNOSIS — K862 Cyst of pancreas: Secondary | ICD-10-CM

## 2021-01-29 DIAGNOSIS — K7460 Unspecified cirrhosis of liver: Secondary | ICD-10-CM

## 2021-01-29 DIAGNOSIS — K7581 Nonalcoholic steatohepatitis (NASH): Secondary | ICD-10-CM
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
## 2021-02-05 ENCOUNTER — Ambulatory Visit: Payer: Medicare PPO | Admitting: Cardiology

## 2021-02-12 ENCOUNTER — Ambulatory Visit: Admit: 2021-02-12 | Discharge: 2021-02-13 | Payer: MEDICARE | Attending: Clinical | Primary: Clinical

## 2021-02-12 DIAGNOSIS — K7460 Unspecified cirrhosis of liver: Principal | ICD-10-CM

## 2021-02-12 DIAGNOSIS — E1142 Type 2 diabetes mellitus with diabetic polyneuropathy: Principal | ICD-10-CM

## 2021-02-12 DIAGNOSIS — K7581 Nonalcoholic steatohepatitis (NASH): Principal | ICD-10-CM

## 2021-02-12 DIAGNOSIS — Z794 Long term (current) use of insulin: Principal | ICD-10-CM

## 2021-02-19 ENCOUNTER — Ambulatory Visit: Admit: 2021-02-19 | Discharge: 2021-02-20 | Disposition: A | Discharge status: Home / Self Care | Payer: MEDICARE

## 2021-02-19 ENCOUNTER — Emergency Department: Admit: 2021-02-19 | Discharge: 2021-02-20 | Disposition: A | Discharge status: Home / Self Care | Payer: MEDICARE

## 2021-02-19 DIAGNOSIS — R5383 Other fatigue: Principal | ICD-10-CM

## 2021-02-19 DIAGNOSIS — N3000 Acute cystitis without hematuria: Principal | ICD-10-CM

## 2021-02-19 DIAGNOSIS — R509 Fever, unspecified: Principal | ICD-10-CM

## 2021-02-19 DIAGNOSIS — R7989 Other specified abnormal findings of blood chemistry: Principal | ICD-10-CM

## 2021-02-19 MED ORDER — NITROFURANTOIN MONOHYDRATE/MACROCRYSTALS 100 MG CAPSULE
ORAL_CAPSULE | Freq: Two times a day (BID) | ORAL | 0 refills | 7 days | Status: CP
Start: 2021-02-19 — End: 2021-02-26
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
## 2021-03-10 ENCOUNTER — Other Ambulatory Visit: Payer: Self-pay

## 2021-03-10 ENCOUNTER — Encounter: Payer: Self-pay | Admitting: Cardiology

## 2021-03-10 ENCOUNTER — Ambulatory Visit (INDEPENDENT_AMBULATORY_CARE_PROVIDER_SITE_OTHER): Payer: Medicare PPO | Admitting: Cardiology

## 2021-03-10 VITALS — BP 134/62 | HR 74 | Ht 63.0 in | Wt 310.4 lb

## 2021-03-10 DIAGNOSIS — I10 Essential (primary) hypertension: Secondary | ICD-10-CM

## 2021-03-10 DIAGNOSIS — I428 Other cardiomyopathies: Secondary | ICD-10-CM | POA: Diagnosis not present

## 2021-03-10 DIAGNOSIS — I5022 Chronic systolic (congestive) heart failure: Secondary | ICD-10-CM

## 2021-03-10 DIAGNOSIS — K7460 Unspecified cirrhosis of liver: Secondary | ICD-10-CM

## 2021-03-10 DIAGNOSIS — K7581 Nonalcoholic steatohepatitis (NASH): Secondary | ICD-10-CM | POA: Diagnosis not present

## 2021-03-10 NOTE — Patient Instructions (Signed)
Medication Instructions:   Continue same medications  *If you need a refill on your cardiac medications before your next appointment, please call your pharmacy*   Lab Work:  None ordered   Testing/Procedures:  Schedule Echo   Follow-Up: At [facility], you and your health needs are our priority.  As part of our continuing mission to provide you with exceptional heart care, we have created designated Provider Care Teams.  These Care Teams include your primary Cardiologist (physician) and Advanced Practice Providers (APPs -  Physician Assistants and Nurse Practitioners) who all work together to provide you with the care you need, when you need it.  We recommend signing up for the patient portal called "MyChart".  Sign up information is provided on this After Visit Summary.  MyChart is used to connect with patients for Virtual Visits (Telemedicine).  Patients are able to view lab/test results, encounter notes, upcoming appointments, etc.  Non-urgent messages can be sent to your provider as well.   To learn more about what you can do with MyChart, go to [email or web address].    Your next appointment:  3 months   The format for your next appointment: Office    Provider:  Dr,Jordan

## 2021-04-03 ENCOUNTER — Other Ambulatory Visit: Payer: Self-pay

## 2021-04-03 ENCOUNTER — Ambulatory Visit (HOSPITAL_COMMUNITY): Payer: Medicare PPO | Attending: Cardiology

## 2021-04-03 DIAGNOSIS — I10 Essential (primary) hypertension: Secondary | ICD-10-CM

## 2021-04-03 DIAGNOSIS — K7460 Unspecified cirrhosis of liver: Secondary | ICD-10-CM

## 2021-04-03 DIAGNOSIS — I428 Other cardiomyopathies: Secondary | ICD-10-CM | POA: Diagnosis not present

## 2021-04-03 DIAGNOSIS — I5022 Chronic systolic (congestive) heart failure: Secondary | ICD-10-CM | POA: Diagnosis present

## 2021-04-03 DIAGNOSIS — K7581 Nonalcoholic steatohepatitis (NASH): Secondary | ICD-10-CM

## 2021-04-03 LAB — ECHOCARDIOGRAM COMPLETE
AR max vel: 3.12 cm2
AV Area VTI: 3.18 cm2
AV Area mean vel: 3.31 cm2
AV Mean grad: 9.5 mmHg
AV Peak grad: 18 mmHg
Ao pk vel: 2.12 m/s
Area-P 1/2: 3.28 cm2
MV VTI: 3.74 cm2
S' Lateral: 4.2 cm

## 2021-04-03 MED ORDER — PERFLUTREN LIPID MICROSPHERE
1.0000 mL | INTRAVENOUS | Status: AC | PRN
  Administered 2021-04-03: 1 mL via INTRAVENOUS

## 2021-05-02 ENCOUNTER — Ambulatory Visit
Admission: RE | Admit: 2021-05-02 | Discharge: 2021-05-02 | Disposition: A | Discharge status: Home / Self Care | Payer: Medicare PPO | Source: Ambulatory Visit | Attending: Transplant Hepatology | Admitting: Transplant Hepatology

## 2021-05-02 ENCOUNTER — Other Ambulatory Visit: Payer: Self-pay

## 2021-05-02 DIAGNOSIS — K7460 Unspecified cirrhosis of liver: Secondary | ICD-10-CM

## 2021-05-02 DIAGNOSIS — K862 Cyst of pancreas: Secondary | ICD-10-CM

## 2021-05-02 DIAGNOSIS — K7581 Nonalcoholic steatohepatitis (NASH): Secondary | ICD-10-CM

## 2021-05-02 MED ORDER — GADOBENATE DIMEGLUMINE 529 MG/ML IV SOLN
20.0000 mL | Freq: Once | INTRAVENOUS | Status: AC | PRN
  Administered 2021-05-02: 20 mL via INTRAVENOUS

## 2021-05-09 ENCOUNTER — Telehealth: Payer: Self-pay | Admitting: Gastroenterology

## 2021-05-09 DIAGNOSIS — K7581 Nonalcoholic steatohepatitis (NASH): Secondary | ICD-10-CM

## 2021-05-09 DIAGNOSIS — K7460 Unspecified cirrhosis of liver: Secondary | ICD-10-CM

## 2021-05-09 DIAGNOSIS — I8500 Esophageal varices without bleeding: Secondary | ICD-10-CM

## 2021-05-09 DIAGNOSIS — D649 Anemia, unspecified: Secondary | ICD-10-CM

## 2021-05-09 NOTE — Telephone Encounter (Signed)
Pt states that she was bleeding throughher mouth yesterday, She saw her liver doctor today who told her that she needs to see Dr. Lyndel Safe for this. She wants to be seen asap. Pls call her.

## 2021-05-09 NOTE — Telephone Encounter (Signed)
Spoke with patient. Patient states she she started having bleeding in her mouth 8/18 that last approx 5 min and also occurred over night. Patient states she took her first flight and wonders if this may have caused the bleeding in her mouth.Reports also having some soreness in her throat as she has had esophageal banding in the past. Patient states she spoke with Dr Manuella Ghazi and was recommended to make appt with [facility]. Patient returning home tomorrow and would like to be seen. She states she was advised by Dr Manuella Ghazi that if bleeding continues to go to ED. I have made appt with Amy E as Dr Lyndel Safe has no appts available anytime soon. Patient scheduled for 06/20/21 at 10am.

## 2021-05-09 NOTE — Telephone Encounter (Signed)
Attempt to contact patient. No answer. Message left on patient voicemail.

## 2021-05-12 NOTE — Telephone Encounter (Signed)
Spoke with patient, Informed patient of recommendations. Patient states she will come in labs tomorrow. Lab orders have been placed in Epic. Patient has already scheduled appt for 9/21 at 11am. Patient states she is not having anymore bleeding at this moment. Patient advised to call office if anything further is needed.

## 2021-05-12 NOTE — Telephone Encounter (Signed)
No  Current Outpatient Medications on File Prior to Visit  Medication Sig Dispense Refill   acetaminophen (TYLENOL) 500 MG tablet Take 1,000 mg by mouth every 6 (six) hours as needed (pain).     cholestyramine (QUESTRAN) 4 g packet Take 1 packet by mouth as needed (itching).     Dulaglutide (TRULICITY) 1.5 JS/9.7WY SOPN Inject 1.5 mg into the skin every Friday.     escitalopram (LEXAPRO) 10 MG tablet Take 1 tablet by mouth daily.     furosemide (LASIX) 80 MG tablet Take 1 tablet (80 mg total) by mouth 2 (two) times daily. (Patient taking differently: Take 80 mg by mouth See admin instructions. Take 1 tablet (80 mg) in the morning & take 0.5 tablet (40 mg) by mouth in the afternoon.) 60 tablet 3   glipiZIDE (GLUCOTROL XL) 5 MG 24 hr tablet TAKE 1 TABLET(5 MG) BY MOUTH DAILY     lactulose (CHRONULAC) 10 GM/15ML solution Take by mouth.     pantoprazole (PROTONIX) 40 MG tablet Take 1 tablet (40 mg total) by mouth daily. 90 tablet 3   sacubitril-valsartan (ENTRESTO) 49-51 MG Take 1 tablet by mouth 2 (two) times daily.     spironolactone (ALDACTONE) 50 MG tablet Take one  Tablet once daily. Watch blood pressure and weight. If no significant weight loss increase to 100 mg once daily. (Patient taking differently: Take 50 mg by mouth in the morning. Take one  Tablet once daily. Watch blood pressure and weight. If no significant weight loss increase to 100 mg once daily.) 60 tablet 3   Vitamin D, Ergocalciferol, (DRISDOL) 1.25 MG (50000 UNIT) CAPS capsule Take 50,000 Units by mouth every Monday.     No current facility-administered medications on file prior to visit.

## 2021-05-12 NOTE — Telephone Encounter (Signed)
Agree with Rebecca Jimenez's plan RG

## 2021-05-13 ENCOUNTER — Other Ambulatory Visit (INDEPENDENT_AMBULATORY_CARE_PROVIDER_SITE_OTHER): Payer: Medicare PPO

## 2021-05-13 ENCOUNTER — Telehealth: Payer: Self-pay

## 2021-05-13 ENCOUNTER — Other Ambulatory Visit: Payer: Self-pay

## 2021-05-13 DIAGNOSIS — K7460 Unspecified cirrhosis of liver: Secondary | ICD-10-CM | POA: Diagnosis not present

## 2021-05-13 DIAGNOSIS — D649 Anemia, unspecified: Secondary | ICD-10-CM | POA: Diagnosis not present

## 2021-05-13 DIAGNOSIS — K7581 Nonalcoholic steatohepatitis (NASH): Secondary | ICD-10-CM | POA: Diagnosis not present

## 2021-05-13 LAB — CBC
HCT: 32.1 % — ABNORMAL LOW (ref 36.0–46.0)
Hemoglobin: 10.6 g/dL — ABNORMAL LOW (ref 12.0–15.0)
MCHC: 33.2 g/dL (ref 30.0–36.0)
MCV: 92.5 fl (ref 78.0–100.0)
Platelets: 40 10*3/uL — CL (ref 150.0–400.0)
RBC: 3.47 Mil/uL — ABNORMAL LOW (ref 3.87–5.11)
RDW: 15.9 % — ABNORMAL HIGH (ref 11.5–15.5)
WBC: 2.6 10*3/uL — ABNORMAL LOW (ref 4.0–10.5)

## 2021-05-13 NOTE — Telephone Encounter (Signed)
Platelet count is 40 K-at baseline No active GI bleeding RG

## 2021-05-13 NOTE — Telephone Encounter (Signed)
Santiago Glad from [employer] called stating that patient's platelet is 40,000. Says that she will look under the microscope regarding this but the patient does run low. Please advise

## 2021-05-19 ENCOUNTER — Telehealth: Payer: Self-pay | Admitting: *Deleted

## 2021-05-19 NOTE — Telephone Encounter (Signed)
Spoke with [facility] Order placed has been placed for her labs. Per lab patient never came in to get labs. Attempted to contact patient, phone continues to ring no answer and unable to leave message.

## 2021-05-22 DIAGNOSIS — K7460 Unspecified cirrhosis of liver: Principal | ICD-10-CM

## 2021-05-22 DIAGNOSIS — K7581 Nonalcoholic steatohepatitis (NASH): Principal | ICD-10-CM

## 2021-05-22 DIAGNOSIS — K920 Hematemesis: Principal | ICD-10-CM

## 2021-05-27 ENCOUNTER — Ambulatory Visit: Admit: 2021-05-27 | Discharge: 2021-05-28 | Payer: MEDICARE

## 2021-05-27 DIAGNOSIS — K7581 Nonalcoholic steatohepatitis (NASH): Principal | ICD-10-CM

## 2021-05-27 DIAGNOSIS — K7460 Unspecified cirrhosis of liver: Principal | ICD-10-CM

## 2021-06-05 ENCOUNTER — Ambulatory Visit: Admit: 2021-06-05 | Discharge: 2021-06-06 | Payer: MEDICARE

## 2021-06-12 ENCOUNTER — Encounter: Admit: 2021-06-12 | Discharge: 2021-06-16 | Payer: MEDICARE

## 2021-06-12 ENCOUNTER — Ambulatory Visit: Admit: 2021-06-12 | Discharge: 2021-06-16 | Payer: MEDICARE

## 2021-06-20 ENCOUNTER — Ambulatory Visit: Payer: Medicare PPO | Admitting: Physician Assistant

## 2021-06-30 ENCOUNTER — Ambulatory Visit: Admit: 2021-06-30 | Discharge: 2021-07-01 | Payer: MEDICARE

## 2021-06-30 DIAGNOSIS — K7581 Nonalcoholic steatohepatitis (NASH): Principal | ICD-10-CM

## 2021-06-30 DIAGNOSIS — Z23 Encounter for immunization: Principal | ICD-10-CM

## 2021-06-30 DIAGNOSIS — K7460 Unspecified cirrhosis of liver: Principal | ICD-10-CM

## 2021-06-30 MED ORDER — SPIRONOLACTONE 50 MG TABLET
ORAL_TABLET | Freq: Every day | ORAL | 1 refills | 45 days | Status: CP
Start: 2021-06-30 — End: ?
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
## 2021-07-07 ENCOUNTER — Ambulatory Visit: Payer: Medicare PPO | Admitting: Cardiology

## 2021-07-10 ENCOUNTER — Emergency Department: Admit: 2021-07-10 | Discharge: 2021-07-11 | Disposition: A | Discharge status: Home / Self Care | Payer: MEDICARE

## 2021-07-10 ENCOUNTER — Ambulatory Visit: Admit: 2021-07-10 | Discharge: 2021-07-11 | Disposition: A | Discharge status: Home / Self Care | Payer: MEDICARE

## 2021-07-11 MED ORDER — PANTOPRAZOLE 20 MG TABLET,DELAYED RELEASE
ORAL_TABLET | 0 refills | 0 days | Status: CP
Start: 2021-07-11 — End: ?

## 2021-07-14 ENCOUNTER — Ambulatory Visit: Admit: 2021-07-14 | Discharge: 2021-07-14 | Payer: MEDICARE

## 2021-07-14 ENCOUNTER — Encounter: Admit: 2021-07-14 | Discharge: 2021-07-14 | Payer: MEDICARE | Attending: Anesthesiology | Primary: Anesthesiology

## 2021-07-14 DIAGNOSIS — I8500 Esophageal varices without bleeding: Principal | ICD-10-CM

## 2021-07-14 MED ORDER — PANTOPRAZOLE 20 MG TABLET,DELAYED RELEASE
ORAL_TABLET | 0 refills | 0 days | Status: CP
Start: 2021-07-14 — End: ?

## 2021-08-11 ENCOUNTER — Ambulatory Visit: Admit: 2021-08-11 | Discharge: 2021-08-12 | Payer: MEDICARE | Attending: Clinical | Primary: Clinical

## 2021-08-11 DIAGNOSIS — K7581 Nonalcoholic steatohepatitis (NASH): Principal | ICD-10-CM

## 2021-08-11 DIAGNOSIS — K7460 Unspecified cirrhosis of liver: Principal | ICD-10-CM

## 2021-08-22 ENCOUNTER — Encounter: Admit: 2021-08-22 | Discharge: 2021-08-22 | Payer: MEDICARE

## 2021-08-22 ENCOUNTER — Ambulatory Visit: Admit: 2021-08-22 | Discharge: 2021-08-22 | Payer: MEDICARE

## 2021-10-08 ENCOUNTER — Telehealth: Admit: 2021-10-08 | Discharge: 2021-10-09 | Payer: MEDICARE | Attending: Clinical | Primary: Clinical

## 2021-10-08 DIAGNOSIS — K7460 Unspecified cirrhosis of liver: Principal | ICD-10-CM

## 2021-10-08 DIAGNOSIS — K7581 Nonalcoholic steatohepatitis (NASH): Principal | ICD-10-CM

## 2021-10-09 ENCOUNTER — Ambulatory Visit
Admit: 2021-10-09 | Discharge: 2021-10-09 | Disposition: A | Discharge status: Home / Self Care | Payer: MEDICARE | Attending: Student in an Organized Health Care Education/Training Program

## 2021-10-09 ENCOUNTER — Emergency Department
Admit: 2021-10-09 | Discharge: 2021-10-09 | Disposition: A | Discharge status: Home / Self Care | Payer: MEDICARE | Attending: Student in an Organized Health Care Education/Training Program

## 2021-10-09 DIAGNOSIS — R188 Other ascites: Principal | ICD-10-CM

## 2021-10-09 DIAGNOSIS — K7581 Nonalcoholic steatohepatitis (NASH): Principal | ICD-10-CM

## 2021-10-09 DIAGNOSIS — K7460 Unspecified cirrhosis of liver: Principal | ICD-10-CM

## 2021-10-09 DIAGNOSIS — I509 Heart failure, unspecified: Principal | ICD-10-CM

## 2021-10-09 MED ORDER — FUROSEMIDE 40 MG TABLET
ORAL_TABLET | Freq: Two times a day (BID) | ORAL | 0 refills | 14 days | Status: CP
Start: 2021-10-09 — End: 2021-10-23

## 2021-10-09 MED ORDER — SPIRONOLACTONE 100 MG TABLET
ORAL_TABLET | Freq: Every day | ORAL | 0 refills | 14 days | Status: CP
Start: 2021-10-09 — End: 2021-10-23

## 2021-10-20 ENCOUNTER — Ambulatory Visit: Admit: 2021-10-20 | Discharge: 2021-10-20 | Payer: MEDICARE

## 2021-10-20 DIAGNOSIS — K7581 Nonalcoholic steatohepatitis (NASH): Principal | ICD-10-CM

## 2021-10-20 DIAGNOSIS — R600 Localized edema: Principal | ICD-10-CM

## 2021-10-20 DIAGNOSIS — R601 Generalized edema: Principal | ICD-10-CM

## 2021-10-20 DIAGNOSIS — K7460 Unspecified cirrhosis of liver: Principal | ICD-10-CM

## 2021-10-20 MED ORDER — BUMETANIDE 2 MG TABLET
ORAL_TABLET | Freq: Two times a day (BID) | ORAL | 11 refills | 15 days | Status: CP
Start: 2021-10-20 — End: 2022-10-20

## 2021-10-30 ENCOUNTER — Ambulatory Visit: Admit: 2021-10-30 | Discharge: 2021-10-31 | Payer: MEDICARE

## 2021-11-03 ENCOUNTER — Other Ambulatory Visit: Payer: Self-pay | Admitting: Transplant Hepatology

## 2021-11-03 ENCOUNTER — Telehealth: Admit: 2021-11-03 | Discharge: 2021-11-04 | Payer: MEDICARE | Attending: Clinical | Primary: Clinical

## 2021-11-03 DIAGNOSIS — K7581 Nonalcoholic steatohepatitis (NASH): Principal | ICD-10-CM

## 2021-11-03 DIAGNOSIS — K7460 Unspecified cirrhosis of liver: Principal | ICD-10-CM
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
## 2021-11-14 DIAGNOSIS — K7460 Unspecified cirrhosis of liver: Principal | ICD-10-CM

## 2021-11-14 DIAGNOSIS — K7581 Nonalcoholic steatohepatitis (NASH): Principal | ICD-10-CM

## 2021-11-17 ENCOUNTER — Encounter: Payer: Self-pay | Admitting: Cardiology

## 2021-11-17 ENCOUNTER — Ambulatory Visit: Payer: Medicare PPO | Admitting: Cardiology

## 2021-11-17 ENCOUNTER — Other Ambulatory Visit: Payer: Self-pay

## 2021-11-17 VITALS — BP 140/56 | HR 86 | Ht 63.0 in | Wt 315.0 lb

## 2021-11-17 DIAGNOSIS — I5022 Chronic systolic (congestive) heart failure: Secondary | ICD-10-CM | POA: Diagnosis not present

## 2021-11-17 DIAGNOSIS — K7581 Nonalcoholic steatohepatitis (NASH): Secondary | ICD-10-CM

## 2021-11-17 DIAGNOSIS — K7460 Unspecified cirrhosis of liver: Secondary | ICD-10-CM

## 2021-11-17 DIAGNOSIS — I447 Left bundle-branch block, unspecified: Secondary | ICD-10-CM | POA: Diagnosis not present

## 2021-11-17 DIAGNOSIS — I428 Other cardiomyopathies: Secondary | ICD-10-CM | POA: Diagnosis not present

## 2021-11-17 DIAGNOSIS — I5042 Chronic combined systolic (congestive) and diastolic (congestive) heart failure: Secondary | ICD-10-CM

## 2021-11-17 MED ORDER — TORSEMIDE 100 MG PO TABS: 100.0000 mg | ORAL_TABLET | Freq: Every day | ORAL | 3 refills | Status: DC

## 2021-11-17 MED ORDER — BUMETANIDE 2 MG PO TABS: 2.0000 mg | ORAL_TABLET | Freq: Two times a day (BID) | ORAL | Status: DC

## 2021-11-17 MED ORDER — SACUBITRIL-VALSARTAN 49-51 MG PO TABS: 1.0000 | ORAL_TABLET | Freq: Two times a day (BID) | ORAL | 11 refills | Status: DC

## 2021-11-17 MED ORDER — SPIRONOLACTONE 100 MG PO TABS: ORAL_TABLET | ORAL | Status: DC

## 2021-11-17 NOTE — Patient Instructions (Signed)
Stop taking Bumex  Take Torsemide 100 mg daily instead  Increase aldactone to 100 mg daily.  We will await results of MRI.

## 2021-12-01 IMAGING — US US ABDOMEN COMPLETE
1 series · 14 of 25 positions shown · non-contrast
Comparison: Ultrasound dated 06/25/2017.

CLINICAL DATA: 64-year-old female with cirrhosis.

EXAM:
ABDOMEN ULTRASOUND COMPLETE

[Series 1: us abdomen complete · 14 of 59 slices shown]
[im 1/59]
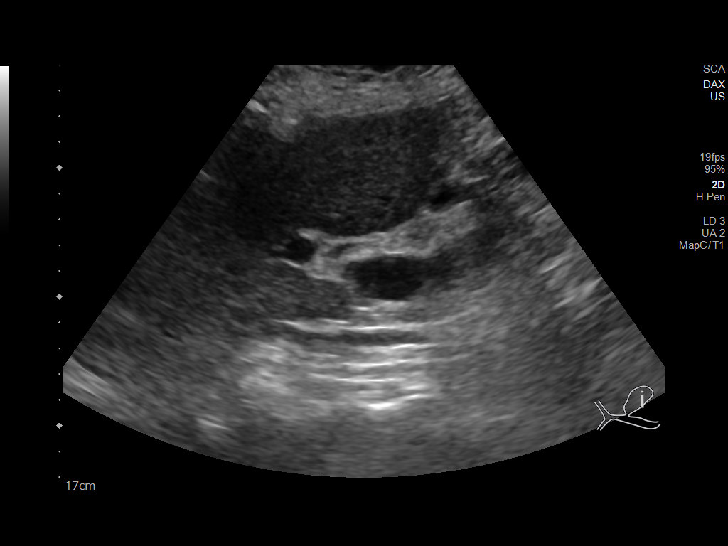
[im 5/59]
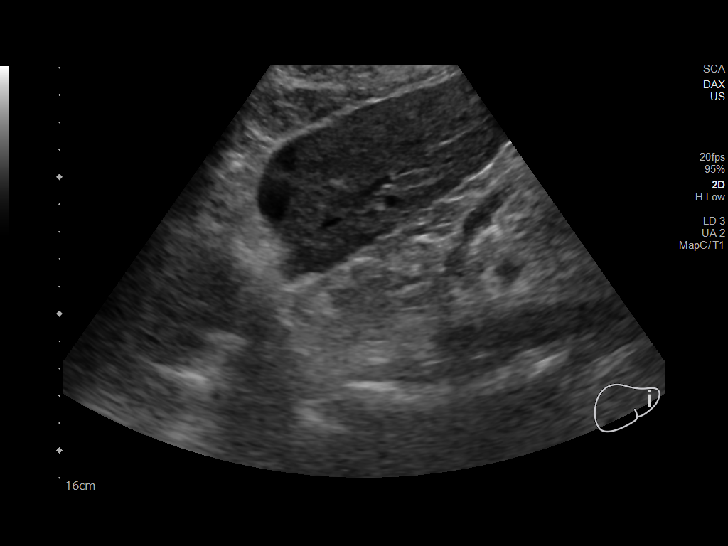
[im 10/59]
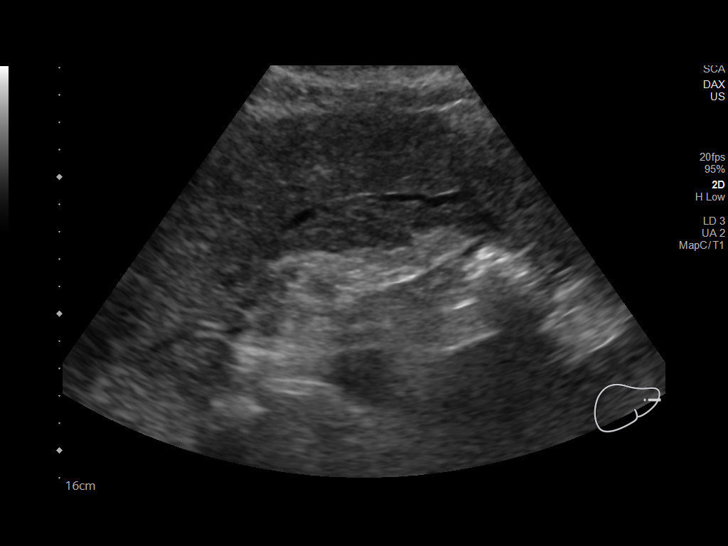
[im 15/59]
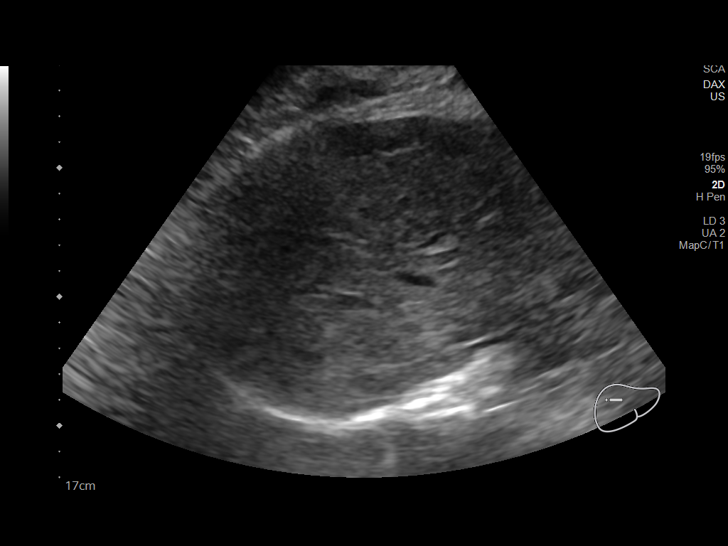
[im 20/59]
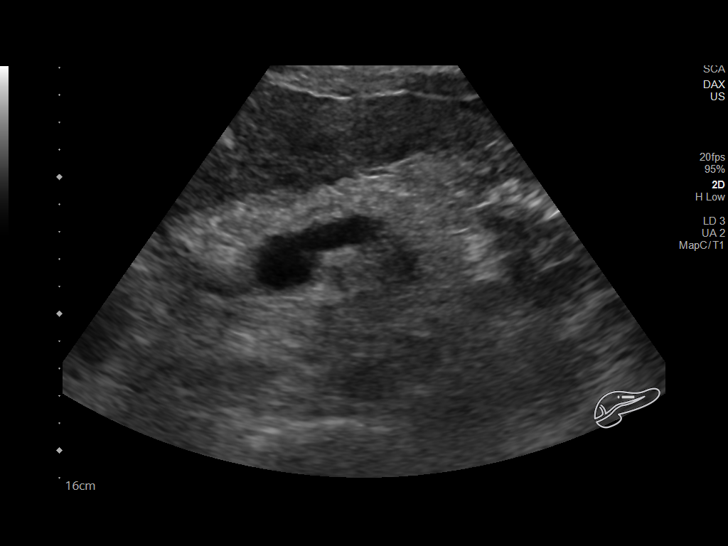
[im 22/59]
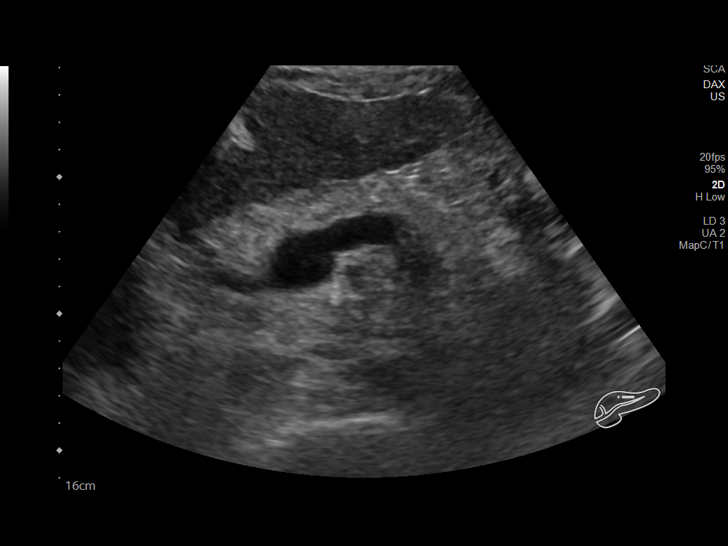
[im 27/59]
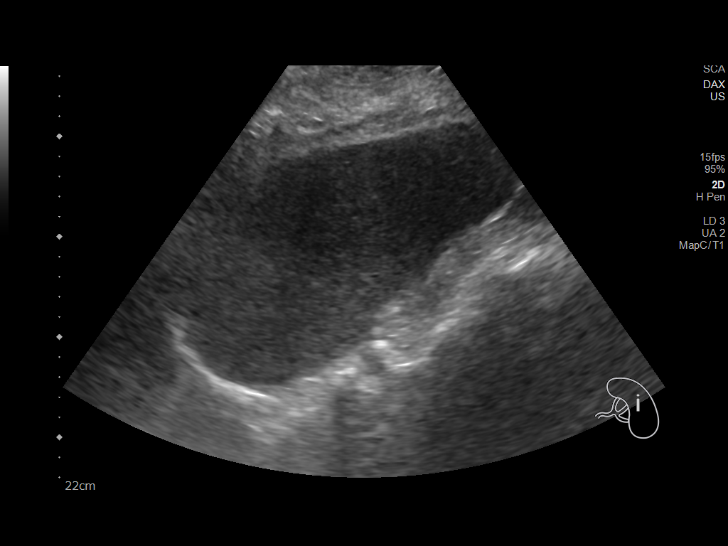
[im 32/59]
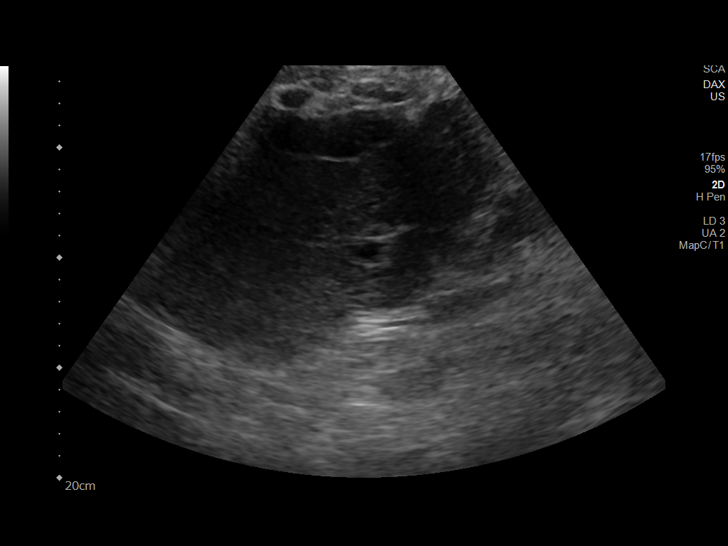
[im 37/59]
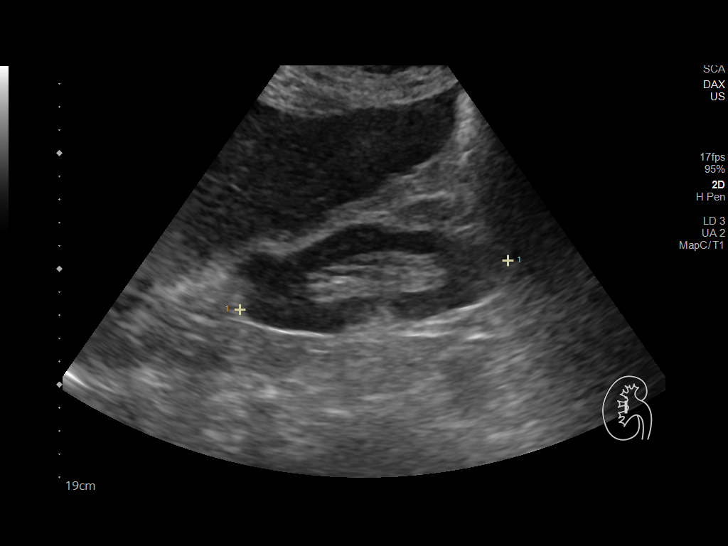
[im 39/59]
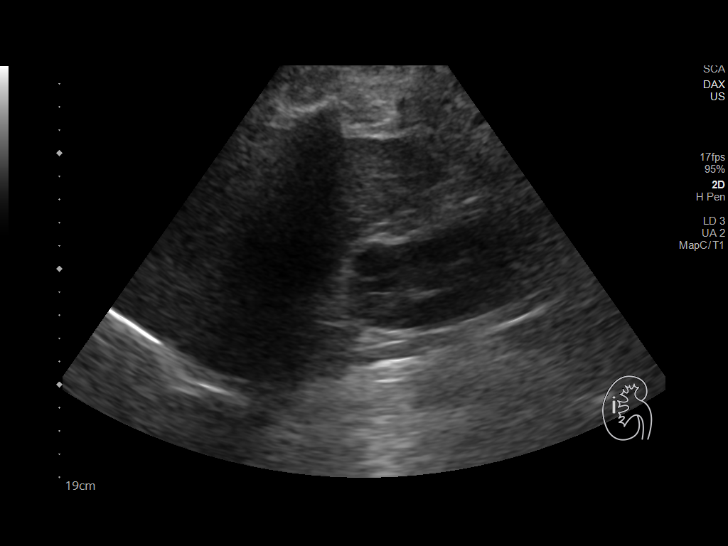
[im 44/59]
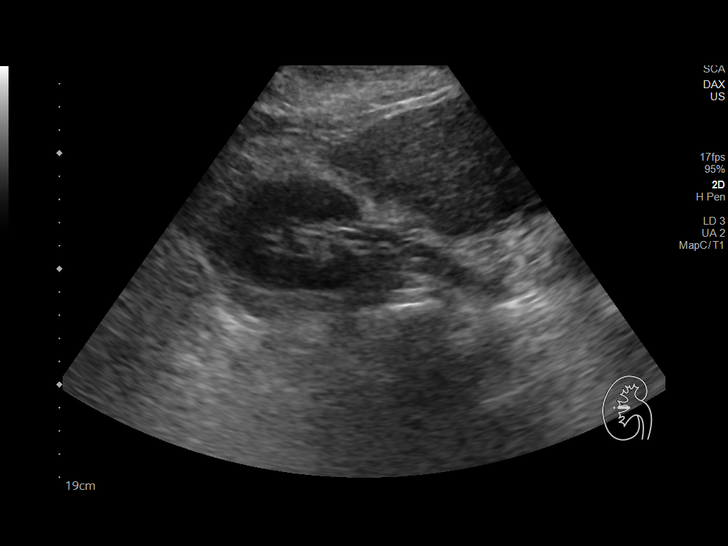
[im 49/59]
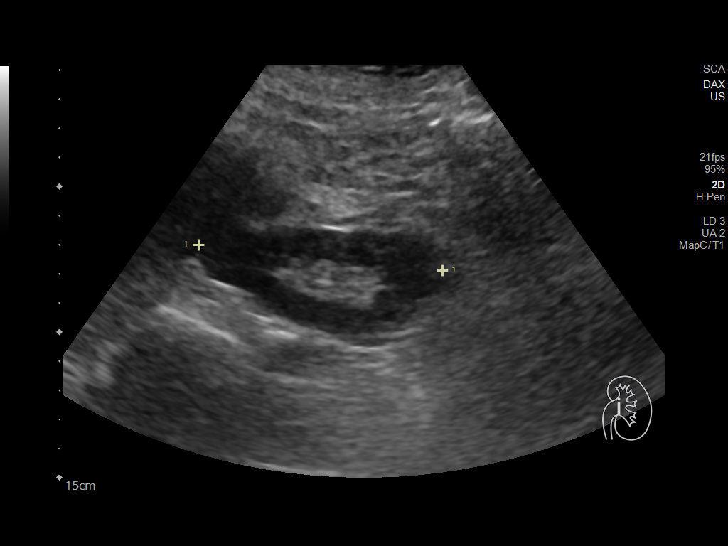
[im 54/59]
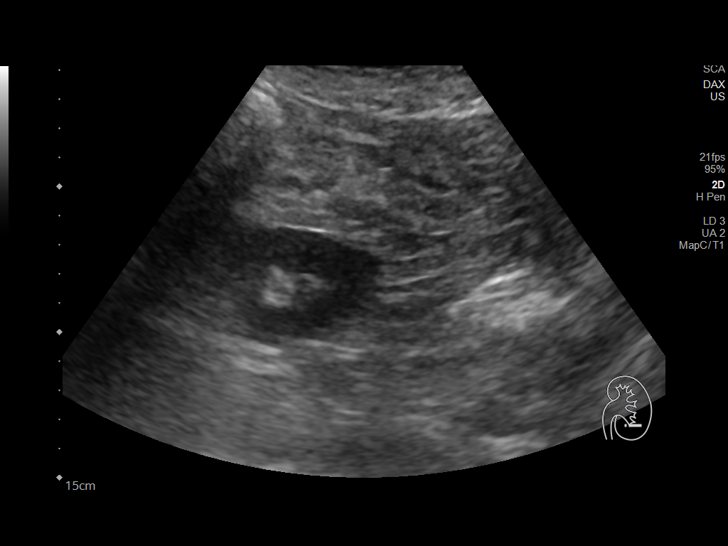
[im 59/59]
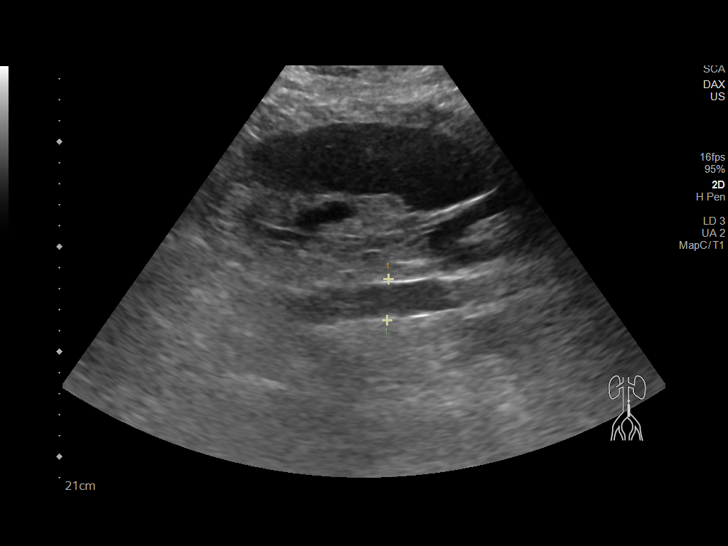

[14 of 25 positions shown; findings below may reference images not displayed]

FINDINGS: Gallbladder: Cholecystectomy.

Common bile duct: Diameter: 3 mm

Liver: The liver demonstrates a coarsened echotexture with surface
irregularity in keeping with cirrhosis. No discrete mass. Portal
vein is patent on color Doppler imaging with normal direction of
blood flow towards the liver.

IVC: No abnormality visualized.

Pancreas: Visualized portion unremarkable.

Spleen: The spleen is enlarged measuring 17 cm in length.

Right Kidney: Length: 12 cm. Normal echogenicity. No hydronephrosis
or shadowing stone. A 2.4 x 1.9 cm hypoechoic lesion in the superior
pole of the pancreas is suboptimally evaluated, likely a cyst.

Left Kidney: Length: 8.4. Normal echogenicity. No hydronephrosis or
shadowing stone.

Abdominal aorta: No aneurysm visualized.

Other findings: Small perihepatic ascites.
IMPRESSION: 1. Cirrhosis with evidence of hypertension including splenomegaly
and small ascites.
2. Patent main portal vein with hepatopetal flow.

## 2021-12-04 ENCOUNTER — Ambulatory Visit: Admit: 2021-12-04 | Discharge: 2021-12-05 | Payer: MEDICARE

## 2021-12-11 ENCOUNTER — Other Ambulatory Visit: Payer: Self-pay

## 2021-12-11 ENCOUNTER — Ambulatory Visit
Admission: RE | Admit: 2021-12-11 | Discharge: 2021-12-11 | Disposition: A | Discharge status: Home / Self Care | Payer: Medicare PPO | Source: Ambulatory Visit | Attending: Transplant Hepatology | Admitting: Transplant Hepatology

## 2021-12-11 DIAGNOSIS — K7460 Unspecified cirrhosis of liver: Secondary | ICD-10-CM

## 2021-12-11 MED ORDER — GADOBENATE DIMEGLUMINE 529 MG/ML IV SOLN
20.0000 mL | Freq: Once | INTRAVENOUS | Status: AC | PRN
  Administered 2021-12-11: 20 mL via INTRAVENOUS

## 2021-12-24 ENCOUNTER — Ambulatory Visit: Admit: 2021-12-24 | Payer: MEDICARE | Attending: Clinical | Primary: Clinical

## 2022-01-06 ENCOUNTER — Other Ambulatory Visit: Payer: Self-pay

## 2022-01-06 ENCOUNTER — Other Ambulatory Visit: Payer: Self-pay | Admitting: Psychiatry

## 2022-01-07 ENCOUNTER — Other Ambulatory Visit: Payer: Self-pay | Admitting: Transplant Hepatology

## 2022-01-07 DIAGNOSIS — Z1289 Encounter for screening for malignant neoplasm of other sites: Secondary | ICD-10-CM

## 2022-01-07 DIAGNOSIS — K7460 Unspecified cirrhosis of liver: Secondary | ICD-10-CM

## 2022-01-12 ENCOUNTER — Ambulatory Visit: Admit: 2022-01-12 | Discharge: 2022-01-13 | Payer: MEDICARE

## 2022-01-12 DIAGNOSIS — I8510 Secondary esophageal varices without bleeding: Principal | ICD-10-CM

## 2022-01-12 DIAGNOSIS — K7460 Unspecified cirrhosis of liver: Principal | ICD-10-CM

## 2022-01-12 DIAGNOSIS — K7581 Nonalcoholic steatohepatitis (NASH): Principal | ICD-10-CM

## 2022-01-12 DIAGNOSIS — R188 Other ascites: Principal | ICD-10-CM

## 2022-01-12 DIAGNOSIS — K766 Portal hypertension: Principal | ICD-10-CM

## 2022-01-12 DIAGNOSIS — D696 Thrombocytopenia, unspecified: Principal | ICD-10-CM

## 2022-01-12 DIAGNOSIS — K862 Cyst of pancreas: Principal | ICD-10-CM

## 2022-01-12 DIAGNOSIS — R600 Localized edema: Principal | ICD-10-CM

## 2022-01-12 DIAGNOSIS — K7682 Hepatic encephalopathy (CMS-HCC): Principal | ICD-10-CM

## 2022-01-26 ENCOUNTER — Ambulatory Visit: Admit: 2022-01-26 | Discharge: 2022-01-29 | Disposition: A | Discharge status: Home / Self Care | Payer: MEDICARE

## 2022-01-29 MED ORDER — AMOXICILLIN 875 MG-POTASSIUM CLAVULANATE 125 MG TABLET
ORAL_TABLET | Freq: Two times a day (BID) | ORAL | 0 refills | 7 days | Status: CP
Start: 2022-01-29 — End: ?

## 2022-01-29 MED ORDER — TORSEMIDE 100 MG TABLET
ORAL_TABLET | Freq: Every day | ORAL | 0 refills | 30 days | Status: CP
Start: 2022-01-29 — End: 2022-02-28
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
## 2022-03-18 ENCOUNTER — Ambulatory Visit: Payer: Medicare PPO | Admitting: Cardiology

## 2022-04-14 ENCOUNTER — Encounter: Payer: Self-pay | Admitting: Oncology

## 2022-04-14 ENCOUNTER — Other Ambulatory Visit: Payer: Self-pay | Admitting: Oncology

## 2022-04-14 ENCOUNTER — Inpatient Hospital Stay: Payer: Medicare PPO

## 2022-04-14 ENCOUNTER — Inpatient Hospital Stay: Payer: Medicare PPO | Attending: Oncology | Admitting: Oncology

## 2022-04-14 VITALS — BP 175/77 | HR 78 | Temp 98.4°F | Resp 14 | Ht 63.0 in | Wt 296.6 lb

## 2022-04-14 DIAGNOSIS — D6489 Other specified anemias: Secondary | ICD-10-CM

## 2022-04-14 DIAGNOSIS — K7581 Nonalcoholic steatohepatitis (NASH): Secondary | ICD-10-CM | POA: Insufficient documentation

## 2022-04-14 DIAGNOSIS — R161 Splenomegaly, not elsewhere classified: Secondary | ICD-10-CM | POA: Diagnosis not present

## 2022-04-14 DIAGNOSIS — D61818 Other pancytopenia: Secondary | ICD-10-CM | POA: Diagnosis present

## 2022-04-14 DIAGNOSIS — K7460 Unspecified cirrhosis of liver: Secondary | ICD-10-CM

## 2022-04-14 LAB — CBC: RBC: 3.38 — AB (ref 3.87–5.11)

## 2022-04-14 LAB — IRON AND TIBC
Iron: 49 ug/dL (ref 28–170)
Saturation Ratios: 12 % (ref 10.4–31.8)
TIBC: 414 ug/dL (ref 250–450)
UIBC: 365 ug/dL

## 2022-04-14 LAB — BASIC METABOLIC PANEL
BUN: 24 — AB (ref 4–21)
CO2: 30 — AB (ref 13–22)
Chloride: 102 (ref 99–108)
Creatinine: 1.5 — AB (ref 0.5–1.1)
Glucose: 175
Potassium: 3.6 mEq/L (ref 3.5–5.1)
Sodium: 140 (ref 137–147)

## 2022-04-14 LAB — HEPATIC FUNCTION PANEL
ALT: 21 U/L (ref 7–35)
AST: 33 (ref 13–35)
Alkaline Phosphatase: 161 — AB (ref 25–125)
Bilirubin, Total: 1.5

## 2022-04-14 LAB — COMPREHENSIVE METABOLIC PANEL
Albumin: 3.6 (ref 3.5–5.0)
Calcium: 8.8 (ref 8.7–10.7)

## 2022-04-14 LAB — FERRITIN: Ferritin: 11 ng/mL (ref 11–307)

## 2022-04-14 LAB — CBC AND DIFFERENTIAL
HCT: 29 — AB (ref 36–46)
Hemoglobin: 9.5 — AB (ref 12.0–16.0)
Neutrophils Absolute: 1.78
Platelets: 45 10*3/uL — AB (ref 150–400)
WBC: 2.7

## 2022-04-14 LAB — FOLATE: Folate: 7.4 ng/mL (ref >5.9)

## 2022-04-14 LAB — VITAMIN B12: Vitamin B-12: 404 pg/mL (ref 180–914)

## 2022-04-14 NOTE — Progress Notes (Unsigned)
[facility]  [address] [place],  [place]  [address] [phone]  Clinic Day:  04/14/2022  Referring physician: Myrlene Broker, MD   HISTORY OF PRESENT ILLNESS:  The patient is a 66 y.o. female who I was asked to reevaluate for thrombocytopenia.  Recent labs showed a low platelet count of 45.  However she was actually pancytopenic as her white count was low at 1.9 and her hemoglobin was low at 8.8.  Of note, this patient has a known history of NASH cirrhosis and secondary splenomegaly.  She is seen every 6 months at [facility] by hepatologist every 6 months to ensure her cirrhosis is not worsening or leading to the development of hepatocellular carcinoma.  The patient was last seen by me in June 2023.  At that time, she was pancytopenic.  However, her hemoglobin was normal at 12 point.  The patient states that she does notice intermittent GI blood loss.  She has a known history of esophageal varices, which are routinely followed.  PHYSICAL EXAM:  Blood pressure (!) 175/77, pulse 78, temperature 98.4 F (36.9 C), resp. rate 14, height 5' 3"  (1.6 m), weight 296 lb 9.6 oz (134.5 kg), SpO2 95 %. Wt Readings from Last 3 Encounters:  04/14/22 296 lb 9.6 oz (134.5 kg)  11/17/21 (!) 315 lb (142.9 kg)  03/10/21 (!) 310 lb 6.4 oz (140.8 kg)   Body mass index is 52.54 kg/m. Performance status (ECOG): 1 Physical Exam Constitutional:      Appearance: Normal appearance. She is not ill-appearing.  HENT:     Mouth/Throat:     Mouth: Mucous membranes are moist.     Pharynx: Oropharynx is clear. No oropharyngeal exudate or posterior oropharyngeal erythema.  Cardiovascular:     Rate and Rhythm: Normal rate and regular rhythm.     Heart sounds: No murmur heard.    No friction rub. No gallop.  Pulmonary:     Effort: Pulmonary effort is normal. No respiratory distress.     Breath sounds: Normal breath sounds. No wheezing, rhonchi or rales.  Abdominal:      General: Bowel sounds are normal. There is no distension.     Palpations: Abdomen is soft. There is no mass.     Tenderness: There is no abdominal tenderness.  Musculoskeletal:        General: No swelling.     Right lower leg: No edema.     Left lower leg: No edema.  Lymphadenopathy:     Cervical: No cervical adenopathy.     Upper Body:     Right upper body: No supraclavicular or axillary adenopathy.     Left upper body: No supraclavicular or axillary adenopathy.     Lower Body: No right inguinal adenopathy. No left inguinal adenopathy.  Skin:    General: Skin is warm.     Coloration: Skin is not jaundiced.     Findings: No lesion or rash.  Neurological:     General: No focal deficit present.     Mental Status: She is alert and oriented to person, place, and time. Mental status is at baseline.  Psychiatric:        Mood and Affect: Mood normal.        Behavior: Behavior normal.        Thought Content: Thought content normal.   LABS:    Iron, B12, folate ASSESSMENT & PLAN:  A 66 y.o. female who I was asked to reevaluate  for pancytopenia.  Previous studies have shown her low counts to be due to NASH cirrhosis and secondary splenomegaly.  When comparing her labs today to what they were 3 years ago, the only value that is significantly lower is her hemoglobin.  I will check her iron, vitamin B12, folate levels today to ensure there are no nutritional deficiencies factoring into her low hemoglobin.  Otherwise, I will see her back in 4 months for repeat clinical assessment.  The patient understands all the plans discussed today and is in agreement with them.  I do appreciate Myrlene Broker, MD for his new consult.   Rebecca Streater Macarthur Critchley, MD

## 2022-04-29 ENCOUNTER — Encounter (INDEPENDENT_AMBULATORY_CARE_PROVIDER_SITE_OTHER): Payer: Self-pay

## 2022-05-19 DIAGNOSIS — R188 Other ascites: Principal | ICD-10-CM

## 2022-05-19 DIAGNOSIS — K7460 Unspecified cirrhosis of liver: Principal | ICD-10-CM

## 2022-05-19 DIAGNOSIS — R600 Localized edema: Principal | ICD-10-CM

## 2022-05-19 DIAGNOSIS — R252 Cramp and spasm: Principal | ICD-10-CM

## 2022-05-19 DIAGNOSIS — K7581 Nonalcoholic steatohepatitis (NASH): Principal | ICD-10-CM

## 2022-05-19 MED ORDER — SPIRONOLACTONE 50 MG TABLET
ORAL_TABLET | Freq: Every day | ORAL | 11 refills | 30 days | Status: CP
Start: 2022-05-19 — End: ?

## 2022-05-20 ENCOUNTER — Ambulatory Visit: Admit: 2022-05-20 | Discharge: 2022-05-21 | Payer: MEDICARE

## 2022-05-21 DIAGNOSIS — K766 Portal hypertension: Principal | ICD-10-CM

## 2022-05-21 DIAGNOSIS — K7581 Nonalcoholic steatohepatitis (NASH): Principal | ICD-10-CM

## 2022-05-21 DIAGNOSIS — D649 Anemia, unspecified: Principal | ICD-10-CM

## 2022-05-21 DIAGNOSIS — K7460 Unspecified cirrhosis of liver: Principal | ICD-10-CM

## 2022-05-21 DIAGNOSIS — I8510 Secondary esophageal varices without bleeding: Principal | ICD-10-CM

## 2022-05-28 ENCOUNTER — Ambulatory Visit: Admit: 2022-05-28 | Discharge: 2022-05-28 | Payer: MEDICARE

## 2022-05-28 ENCOUNTER — Encounter: Admit: 2022-05-28 | Discharge: 2022-05-28 | Payer: MEDICARE

## 2022-05-29 ENCOUNTER — Other Ambulatory Visit: Admit: 2022-05-29 | Discharge: 2022-05-30 | Payer: MEDICARE

## 2022-06-19 DIAGNOSIS — D61818 Other pancytopenia: Secondary | ICD-10-CM | POA: Insufficient documentation
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
## 2022-06-22 ENCOUNTER — Ambulatory Visit: Payer: Medicare PPO | Attending: Cardiology | Admitting: Cardiology

## 2022-06-22 ENCOUNTER — Encounter: Payer: Self-pay | Admitting: Cardiology

## 2022-06-22 VITALS — BP 151/66 | HR 88 | Ht 63.0 in | Wt 320.8 lb

## 2022-06-22 DIAGNOSIS — K7581 Nonalcoholic steatohepatitis (NASH): Secondary | ICD-10-CM | POA: Diagnosis not present

## 2022-06-22 DIAGNOSIS — I447 Left bundle-branch block, unspecified: Secondary | ICD-10-CM

## 2022-06-22 DIAGNOSIS — K7460 Unspecified cirrhosis of liver: Secondary | ICD-10-CM

## 2022-06-22 DIAGNOSIS — I5032 Chronic diastolic (congestive) heart failure: Secondary | ICD-10-CM | POA: Diagnosis not present

## 2022-06-22 MED ORDER — LOSARTAN POTASSIUM 25 MG PO TABS: 25.0000 mg | ORAL_TABLET | Freq: Every day | ORAL | 3 refills | Status: DC

## 2022-06-22 NOTE — Patient Instructions (Signed)
Try splitting your diuretic dose and take twice a day. Try taking aldactone and torsemide 50 mg bid  Ask you gastroenterologist about taking iron supplement  Start losartan 25 mg daily.

## 2022-07-13 ENCOUNTER — Ambulatory Visit: Admit: 2022-07-13 | Discharge: 2022-07-14 | Payer: MEDICARE

## 2022-07-13 DIAGNOSIS — Z1159 Encounter for screening for other viral diseases: Principal | ICD-10-CM

## 2022-07-13 DIAGNOSIS — K7581 Nonalcoholic steatohepatitis (NASH): Principal | ICD-10-CM

## 2022-07-13 DIAGNOSIS — R252 Cramp and spasm: Principal | ICD-10-CM

## 2022-07-13 DIAGNOSIS — Z6841 Body Mass Index (BMI) 40.0 and over, adult: Principal | ICD-10-CM

## 2022-07-13 DIAGNOSIS — K7460 Unspecified cirrhosis of liver: Principal | ICD-10-CM

## 2022-07-20 ENCOUNTER — Other Ambulatory Visit: Payer: Self-pay | Admitting: Physician Assistant

## 2022-07-20 DIAGNOSIS — K7460 Unspecified cirrhosis of liver: Secondary | ICD-10-CM

## 2022-07-20 DIAGNOSIS — Z1289 Encounter for screening for malignant neoplasm of other sites: Secondary | ICD-10-CM

## 2022-08-06 DIAGNOSIS — K7460 Unspecified cirrhosis of liver: Principal | ICD-10-CM

## 2022-08-06 DIAGNOSIS — K7581 Nonalcoholic steatohepatitis (NASH): Principal | ICD-10-CM

## 2022-08-16 ENCOUNTER — Other Ambulatory Visit: Payer: Self-pay | Admitting: Oncology

## 2022-08-16 DIAGNOSIS — D6489 Other specified anemias: Secondary | ICD-10-CM

## 2022-08-16 NOTE — Progress Notes (Signed)
[facility]  [address] [place],  [place]  [address] [phone]  Clinic Day:  08/17/2022  Referring physician: Myrlene Broker, MD   HISTORY OF PRESENT ILLNESS:  The patient is a 66 y.o. 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  She comes in today to have her peripheral counts reassessed.  As it pertains to her baseline liver disease, she is seen every 6 months at [facility] by hepatology. The patient claims she has had black stools over these past few weeks/months.  She brings biopsy that she is scheduled for liver MRI in the forthcoming days.  She has a known history of esophageal varices, which have had to be banded in the past.  PHYSICAL EXAM:  Blood pressure (!) 147/66, pulse 87, temperature 97.9 F (36.6 C), resp. rate 20, height 5' 3"  (1.6 m), weight (!) 324 lb 9.6 oz (147.2 kg), SpO2 95 %. Wt Readings from Last 3 Encounters:  08/17/22 (!) 324 lb 9.6 oz (147.2 kg)  06/22/22 (!) 320 lb 12.8 oz (145.5 kg)  04/14/22 296 lb 9.6 oz (134.5 kg)   Body mass index is 57.5 kg/m. Performance status (ECOG): 1 Physical Exam Constitutional:      Appearance: Normal appearance. She is not ill-appearing.  HENT:     Mouth/Throat:     Mouth: Mucous membranes are moist.     Pharynx: Oropharynx is clear. No oropharyngeal exudate or posterior oropharyngeal erythema.  Cardiovascular:     Rate and Rhythm: Normal rate and regular rhythm.     Heart sounds: No murmur heard.    No friction rub. No gallop.  Pulmonary:     Effort: Pulmonary effort is normal. No respiratory distress.     Breath sounds: Normal breath sounds. No wheezing, rhonchi or rales.  Abdominal:     General: Bowel sounds are normal. There is no distension.     Palpations: Abdomen is soft. There is no mass.     Tenderness: There is no abdominal tenderness.  Musculoskeletal:        General: No swelling.     Right lower leg: No  edema.     Left lower leg: No edema.  Lymphadenopathy:     Cervical: No cervical adenopathy.     Upper Body:     Right upper body: No supraclavicular or axillary adenopathy.     Left upper body: No supraclavicular or axillary adenopathy.     Lower Body: No right inguinal adenopathy. No left inguinal adenopathy.  Skin:    General: Skin is warm.     Coloration: Skin is not jaundiced.     Findings: No lesion or rash.  Neurological:     General: No focal deficit present.     Mental Status: She is alert and oriented to person, place, and time. Mental status is at baseline.  Psychiatric:        Mood and Affect: Mood normal.        Behavior: Behavior normal.        Thought Content: Thought content normal.   LABS:     Latest Reference Range & Units 08/17/22 10:35  Iron 28 - 170 ug/dL 30  UIBC ug/dL 400  TIBC 250 - 450 ug/dL 430  Saturation Ratios 10.4 - 31.8 % 7 (L)  Ferritin 11 - 307 ng/mL 9 (L)  (L): Data is abnormally low  ASSESSMENT & PLAN:  A 66 y.o. female with pancytopenia secondary to  NASH cirrhosis and secondary splenomegaly.  When evaluating her labs today, the patient is clearly more anemic, with labs also showing clear-cut evidence of iron deficiency.  Based on this, I will arrange for her to receive IV iron over these next few weeks to replenish her iron stores and normalize her hemoglobin.  Overall, my concern is her iron deficiency anemia could be due to an upper GI bleed related to her esophageal varices.  As mentioned previously, the patient claims to have had black stools recently.  I strongly encouraged her to speak with her GI physician about having her GI tract re-evaluated.  Otherwise, I will see this patient back in 3 months to reassess her iron and hemoglobin levels to see how well she responded to her upcoming IV iron.  The patient understands all the plans discussed today and is in agreement with them.  I do appreciate Myrlene Broker, MD for his new consult.    Carolyn Sylvia Macarthur Critchley, MD

## 2022-08-17 ENCOUNTER — Other Ambulatory Visit: Payer: Self-pay | Admitting: Pharmacist

## 2022-08-17 ENCOUNTER — Inpatient Hospital Stay: Payer: Medicare PPO | Attending: Oncology | Admitting: Oncology

## 2022-08-17 ENCOUNTER — Telehealth: Payer: Self-pay

## 2022-08-17 ENCOUNTER — Inpatient Hospital Stay: Payer: Medicare PPO

## 2022-08-17 ENCOUNTER — Other Ambulatory Visit: Payer: Self-pay | Admitting: Oncology

## 2022-08-17 VITALS — BP 147/66 | HR 87 | Temp 97.9°F | Resp 20 | Ht 63.0 in | Wt 324.6 lb

## 2022-08-17 DIAGNOSIS — K7460 Unspecified cirrhosis of liver: Secondary | ICD-10-CM | POA: Diagnosis not present

## 2022-08-17 DIAGNOSIS — D61818 Other pancytopenia: Secondary | ICD-10-CM | POA: Diagnosis not present

## 2022-08-17 DIAGNOSIS — K7581 Nonalcoholic steatohepatitis (NASH): Secondary | ICD-10-CM | POA: Insufficient documentation

## 2022-08-17 DIAGNOSIS — D6489 Other specified anemias: Secondary | ICD-10-CM

## 2022-08-17 DIAGNOSIS — D500 Iron deficiency anemia secondary to blood loss (chronic): Secondary | ICD-10-CM

## 2022-08-17 DIAGNOSIS — D509 Iron deficiency anemia, unspecified: Secondary | ICD-10-CM | POA: Diagnosis not present

## 2022-08-17 LAB — IRON AND TIBC
Iron: 30 ug/dL (ref 28–170)
Saturation Ratios: 7 % — ABNORMAL LOW (ref 10.4–31.8)
TIBC: 430 ug/dL (ref 250–450)
UIBC: 400 ug/dL

## 2022-08-17 LAB — CBC AND DIFFERENTIAL
HCT: 23 — AB (ref 36–46)
Hemoglobin: 7.3 — AB (ref 12.0–16.0)
Neutrophils Absolute: 1.58
Platelets: 46 10*3/uL — AB (ref 150–400)
WBC: 2.4

## 2022-08-17 LAB — CBC: RBC: 2.77 — AB (ref 3.87–5.11)

## 2022-08-17 LAB — FERRITIN: Ferritin: 9 ng/mL — ABNORMAL LOW (ref 11–307)

## 2022-08-17 NOTE — Telephone Encounter (Addendum)
Dr Bobby Rumpf reviewed labs and states pt will need iron infusions. I called pt and notified her of above. Once insurance has approved iron infusions, scheduling will call her to set up appt's. Pt verbalized understanding.   Latest Reference Range & Units 08/17/22 10:35  Iron 28 - 170 ug/dL 30  UIBC ug/dL 400  TIBC 250 - 450 ug/dL 430  Saturation Ratios 10.4 - 31.8 % 7 (L)  Ferritin 11 - 307 ng/mL 9 (L)  (L): Data is abnormally low

## 2022-08-19 ENCOUNTER — Ambulatory Visit
Admission: RE | Admit: 2022-08-19 | Discharge: 2022-08-19 | Disposition: A | Discharge status: Home / Self Care | Payer: Medicare PPO | Source: Ambulatory Visit | Attending: Physician Assistant | Admitting: Physician Assistant

## 2022-08-19 DIAGNOSIS — K7460 Unspecified cirrhosis of liver: Secondary | ICD-10-CM

## 2022-08-19 DIAGNOSIS — Z1289 Encounter for screening for malignant neoplasm of other sites: Secondary | ICD-10-CM

## 2022-08-19 MED ORDER — GADOPICLENOL 0.5 MMOL/ML IV SOLN
10.0000 mL | Freq: Once | INTRAVENOUS | Status: AC | PRN
  Administered 2022-08-19: 10 mL via INTRAVENOUS

## 2022-08-20 ENCOUNTER — Encounter: Payer: Self-pay | Admitting: Oncology

## 2022-08-20 MED FILL — Iron Sucrose Inj 20 MG/ML (Fe Equiv): INTRAVENOUS | Qty: 10 | Status: AC

## 2022-08-21 ENCOUNTER — Inpatient Hospital Stay: Payer: Medicare PPO | Attending: Oncology

## 2022-08-21 ENCOUNTER — Ambulatory Visit: Admit: 2022-08-21 | Discharge: 2022-08-22 | Payer: MEDICARE

## 2022-08-21 VITALS — BP 139/58 | HR 77 | Temp 98.2°F | Resp 18 | Ht 63.0 in | Wt 328.0 lb

## 2022-08-21 DIAGNOSIS — D509 Iron deficiency anemia, unspecified: Secondary | ICD-10-CM | POA: Diagnosis present

## 2022-08-21 MED ORDER — ACETAMINOPHEN 325 MG PO TABS
650.0000 mg | ORAL_TABLET | Freq: Once | ORAL | Status: AC
  Administered 2022-08-21: 650 mg via ORAL
  Filled 2022-08-21: qty 2

## 2022-08-21 MED ORDER — FAMOTIDINE IN NACL 20-0.9 MG/50ML-% IV SOLN
20.0000 mg | Freq: Once | INTRAVENOUS | Status: AC
  Administered 2022-08-21: 20 mg via INTRAVENOUS
  Filled 2022-08-21: qty 50

## 2022-08-21 MED ORDER — SODIUM CHLORIDE 0.9 % IV SOLN: Freq: Once | INTRAVENOUS | Status: AC

## 2022-08-21 MED ORDER — SODIUM CHLORIDE 0.9 % IV SOLN
200.0000 mg | Freq: Once | INTRAVENOUS | Status: AC
  Administered 2022-08-21: 200 mg via INTRAVENOUS
  Filled 2022-08-21: qty 200

## 2022-08-21 MED FILL — Iron Sucrose Inj 20 MG/ML (Fe Equiv): INTRAVENOUS | Qty: 10 | Status: AC

## 2022-08-21 NOTE — Patient Instructions (Signed)

## 2022-08-24 ENCOUNTER — Inpatient Hospital Stay: Payer: Medicare PPO

## 2022-08-24 VITALS — BP 140/60 | HR 87 | Temp 98.6°F | Resp 18 | Ht 63.0 in | Wt 325.1 lb

## 2022-08-24 DIAGNOSIS — D509 Iron deficiency anemia, unspecified: Secondary | ICD-10-CM | POA: Diagnosis not present

## 2022-08-24 MED ORDER — SODIUM CHLORIDE 0.9 % IV SOLN: Freq: Once | INTRAVENOUS | Status: AC

## 2022-08-24 MED ORDER — FAMOTIDINE IN NACL 20-0.9 MG/50ML-% IV SOLN
20.0000 mg | Freq: Once | INTRAVENOUS | Status: AC
  Administered 2022-08-24: 20 mg via INTRAVENOUS
  Filled 2022-08-24: qty 50

## 2022-08-24 MED ORDER — ACETAMINOPHEN 325 MG PO TABS
650.0000 mg | ORAL_TABLET | Freq: Once | ORAL | Status: AC
  Administered 2022-08-24: 650 mg via ORAL
  Filled 2022-08-24: qty 2

## 2022-08-24 MED ORDER — SODIUM CHLORIDE 0.9 % IV SOLN
200.0000 mg | Freq: Once | INTRAVENOUS | Status: AC
  Administered 2022-08-24: 200 mg via INTRAVENOUS
  Filled 2022-08-24: qty 200

## 2022-08-24 NOTE — Patient Instructions (Signed)

## 2022-08-25 MED FILL — Iron Sucrose Inj 20 MG/ML (Fe Equiv): INTRAVENOUS | Qty: 10 | Status: AC

## 2022-08-26 ENCOUNTER — Inpatient Hospital Stay: Payer: Medicare PPO

## 2022-08-26 VITALS — BP 140/67 | HR 88 | Temp 98.0°F | Resp 18 | Ht 63.0 in | Wt 325.0 lb

## 2022-08-26 DIAGNOSIS — D509 Iron deficiency anemia, unspecified: Secondary | ICD-10-CM

## 2022-08-26 MED ORDER — ACETAMINOPHEN 325 MG PO TABS: 650.0000 mg | ORAL_TABLET | Freq: Once | ORAL | Status: DC

## 2022-08-26 MED ORDER — SODIUM CHLORIDE 0.9 % IV SOLN: Freq: Once | INTRAVENOUS | Status: AC

## 2022-08-26 MED ORDER — SODIUM CHLORIDE 0.9 % IV SOLN
200.0000 mg | Freq: Once | INTRAVENOUS | Status: AC
  Administered 2022-08-26: 200 mg via INTRAVENOUS
  Filled 2022-08-26: qty 200

## 2022-08-26 MED ORDER — FAMOTIDINE IN NACL 20-0.9 MG/50ML-% IV SOLN: 20.0000 mg | Freq: Once | INTRAVENOUS | Status: DC

## 2022-08-27 MED FILL — Iron Sucrose Inj 20 MG/ML (Fe Equiv): INTRAVENOUS | Qty: 10 | Status: AC

## 2022-08-28 ENCOUNTER — Inpatient Hospital Stay: Payer: Medicare PPO

## 2022-08-28 VITALS — BP 140/61 | HR 73 | Temp 98.2°F | Resp 16 | Wt 325.0 lb

## 2022-08-28 DIAGNOSIS — D509 Iron deficiency anemia, unspecified: Secondary | ICD-10-CM | POA: Diagnosis not present

## 2022-08-28 MED ORDER — ACETAMINOPHEN 325 MG PO TABS
650.0000 mg | ORAL_TABLET | Freq: Once | ORAL | Status: AC
  Administered 2022-08-28: 650 mg via ORAL
  Filled 2022-08-28: qty 2

## 2022-08-28 MED ORDER — SODIUM CHLORIDE 0.9 % IV SOLN
200.0000 mg | Freq: Once | INTRAVENOUS | Status: AC
  Administered 2022-08-28: 200 mg via INTRAVENOUS
  Filled 2022-08-28: qty 200

## 2022-08-28 MED ORDER — SODIUM CHLORIDE 0.9 % IV SOLN: Freq: Once | INTRAVENOUS | Status: AC

## 2022-08-28 MED ORDER — FAMOTIDINE IN NACL 20-0.9 MG/50ML-% IV SOLN
20.0000 mg | Freq: Once | INTRAVENOUS | Status: AC
  Administered 2022-08-28: 20 mg via INTRAVENOUS
  Filled 2022-08-28: qty 50

## 2022-08-31 MED FILL — Iron Sucrose Inj 20 MG/ML (Fe Equiv): INTRAVENOUS | Qty: 10 | Status: AC

## 2022-09-01 ENCOUNTER — Inpatient Hospital Stay: Payer: Medicare PPO

## 2022-09-01 VITALS — BP 149/51 | HR 84 | Temp 98.6°F | Resp 18 | Ht 63.0 in | Wt 325.0 lb

## 2022-09-01 DIAGNOSIS — D509 Iron deficiency anemia, unspecified: Secondary | ICD-10-CM

## 2022-09-01 MED ORDER — FAMOTIDINE IN NACL 20-0.9 MG/50ML-% IV SOLN
20.0000 mg | Freq: Once | INTRAVENOUS | Status: AC
  Administered 2022-09-01: 20 mg via INTRAVENOUS
  Filled 2022-09-01: qty 50

## 2022-09-01 MED ORDER — SODIUM CHLORIDE 0.9 % IV SOLN
200.0000 mg | Freq: Once | INTRAVENOUS | Status: AC
  Administered 2022-09-01: 200 mg via INTRAVENOUS
  Filled 2022-09-01: qty 200

## 2022-09-01 MED ORDER — SODIUM CHLORIDE 0.9 % IV SOLN: Freq: Once | INTRAVENOUS | Status: AC

## 2022-09-01 MED ORDER — ACETAMINOPHEN 325 MG PO TABS
650.0000 mg | ORAL_TABLET | Freq: Once | ORAL | Status: AC
  Administered 2022-09-01: 650 mg via ORAL
  Filled 2022-09-01: qty 2

## 2022-09-01 NOTE — Patient Instructions (Signed)

## 2022-09-02 DIAGNOSIS — N12 Tubulo-interstitial nephritis, not specified as acute or chronic: Principal | ICD-10-CM

## 2022-09-02 MED ORDER — ONDANSETRON 4 MG DISINTEGRATING TABLET
ORAL_TABLET | Freq: Three times a day (TID) | ORAL | 0 refills | 4.00000 days | Status: CP | PRN
Start: 2022-09-02 — End: 2022-09-02

## 2022-09-02 MED ORDER — AMOXICILLIN 875 MG-POTASSIUM CLAVULANATE 125 MG TABLET
ORAL_TABLET | Freq: Two times a day (BID) | ORAL | 0 refills | 10.00000 days | Status: CP
Start: 2022-09-02 — End: 2022-09-02

## 2022-09-03 ENCOUNTER — Ambulatory Visit
Admit: 2022-09-03 | Discharge: 2022-09-03 | Disposition: A | Discharge status: Home / Self Care | Payer: MEDICARE | Attending: Family Medicine

## 2022-09-10 ENCOUNTER — Ambulatory Visit: Admit: 2022-09-10 | Discharge: 2022-09-11 | Payer: MEDICARE

## 2022-09-15 ENCOUNTER — Telehealth: Admit: 2022-09-15 | Discharge: 2022-09-16 | Payer: MEDICARE

## 2022-09-15 DIAGNOSIS — K7581 Nonalcoholic steatohepatitis (NASH): Principal | ICD-10-CM

## 2022-09-15 DIAGNOSIS — K7460 Unspecified cirrhosis of liver: Principal | ICD-10-CM

## 2022-09-15 DIAGNOSIS — K7682 Hepatic encephalopathy (CMS-HCC): Principal | ICD-10-CM

## 2022-09-15 MED ORDER — RIFAXIMIN 550 MG TABLET
ORAL_TABLET | Freq: Two times a day (BID) | ORAL | 11 refills | 30 days | Status: CP
Start: 2022-09-15 — End: 2023-09-15

## 2022-09-16 DIAGNOSIS — K7682 Hepatic encephalopathy (CMS-HCC): Principal | ICD-10-CM

## 2022-10-01 MED ORDER — PEG 3350-ELECTROLYTES 236 GRAM-22.74 GRAM-6.74 GRAM-5.86 GRAM SOLUTION
Freq: Once | ORAL | 0 refills | 1 days | Status: CP
Start: 2022-10-01 — End: 2022-10-01

## 2022-10-20 ENCOUNTER — Ambulatory Visit: Admit: 2022-10-20 | Discharge: 2022-10-20 | Payer: MEDICARE

## 2022-10-20 ENCOUNTER — Encounter
Admit: 2022-10-20 | Discharge: 2022-10-20 | Payer: MEDICARE | Attending: Certified Registered" | Primary: Certified Registered"

## 2022-10-22 DIAGNOSIS — I8500 Esophageal varices without bleeding: Principal | ICD-10-CM

## 2022-11-03 NOTE — Unmapped (Signed)
[facility] Specialty Medication Onboarding    Specialty Medication: Xifaxan  Prior Authorization: Not Required   Financial Assistance: No - MAPs has reached maximum number of attempts to obtain necessary information from the patient in regards to the financial assistance process  Final Copay/Day Supply: $1033.45 / 30    Insurance Restrictions: None     Notes to Pharmacist: None    The triage team has completed the benefits investigation and has determined that the patient is able to fill this medication at [facility]. Please contact the patient to complete the onboarding or follow up with the prescribing physician as needed.

## 2022-11-13 NOTE — Unmapped (Signed)
Opened encounter in error  

## 2022-11-16 NOTE — Progress Notes (Signed)
[facility]  [address] [place],  [place]  [address] [phone]  Clinic Day:  11/17/2022  Referring physician: Myrlene Broker, MD   HISTORY OF PRESENT ILLNESS:  The patient is a 67 y.o. 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  Furthermore, she has had iron deficiency anemia, for which she recently received IV iron to replenish her iron stores and improve her hemoglobin.  She comes in today to have her peripheral counts reassessed.  As it pertains to her baseline liver disease, she was recently seen at [facility] by hepatology.  She claims to have had a few esophageal varices banded.  She denies having any overt forms of blood loss which concern her for having worsening iron deficiency anemia  PHYSICAL EXAM:  Blood pressure (!) 180/77, pulse 81, temperature 98.6 F (37 C), resp. rate 18, height '5\' 3"'$  (1.6 m), weight 295 lb 12.8 oz (134.2 kg), SpO2 96 %. Wt Readings from Last 3 Encounters:  11/17/22 295 lb 12.8 oz (134.2 kg)  09/01/22 (!) 325 lb (147.4 kg)  08/28/22 (!) 325 lb (147.4 kg)   Body mass index is 52.4 kg/m. Performance status (ECOG): 1 Physical Exam Constitutional:      Appearance: Normal appearance. She is not ill-appearing.  HENT:     Mouth/Throat:     Mouth: Mucous membranes are moist.     Pharynx: Oropharynx is clear. No oropharyngeal exudate or posterior oropharyngeal erythema.  Cardiovascular:     Rate and Rhythm: Normal rate and regular rhythm.     Heart sounds: No murmur heard.    No friction rub. No gallop.  Pulmonary:     Effort: Pulmonary effort is normal. No respiratory distress.     Breath sounds: Normal breath sounds. No wheezing, rhonchi or rales.  Abdominal:     General: Bowel sounds are normal. There is no distension.     Palpations: Abdomen is soft. There is no mass.     Tenderness: There is no abdominal tenderness.  Musculoskeletal:        General:  No swelling.     Right lower leg: No edema.     Left lower leg: No edema.  Lymphadenopathy:     Cervical: No cervical adenopathy.     Upper Body:     Right upper body: No supraclavicular or axillary adenopathy.     Left upper body: No supraclavicular or axillary adenopathy.     Lower Body: No right inguinal adenopathy. No left inguinal adenopathy.  Skin:    General: Skin is warm.     Coloration: Skin is not jaundiced.     Findings: No lesion or rash.  Neurological:     General: No focal deficit present.     Mental Status: She is alert and oriented to person, place, and time. Mental status is at baseline.  Psychiatric:        Mood and Affect: Mood normal.        Behavior: Behavior normal.        Thought Content: Thought content normal.    LABS:     Latest Reference Range & Units 11/17/22 10:30  Iron 28 - 170 ug/dL 46  UIBC ug/dL 346  TIBC 250 - 450 ug/dL 392  Saturation Ratios 10.4 - 31.8 % 12  Ferritin 11 - 307 ng/mL 16  Folate >5.9 ng/mL 7.5  Vitamin B12 180 - 914 pg/mL 365    ASSESSMENT & PLAN:  A 67 y.o. female with pancytopenia secondary to NASH cirrhosis and secondary splenomegaly.  She also has iron deficiency anemia.  The patient's iron and hemoglobin levels are both better since she recently received IV iron.  However, her hemoglobin remains suboptimal at being less than 10.  I will give her another course of IV iron over these next few weeks to see if this leads to a further improvement in her iron and hemoglobin levels.  The patient understands that her baseline cirrhosis and secondary splenomegaly will always lead to her having some degree of pancytopenia.  She knows to continue following with hepatology at [facility] on a routine basis to ensure liver decompensation is not developing over time.  Otherwise, I will see her back in 4 months to reassess her iron levels and her peripheral counts.  The patient understands all the plans discussed today and is in agreement with  them.  Rebecca Cotterill Macarthur Critchley, MD

## 2022-11-17 ENCOUNTER — Inpatient Hospital Stay: Payer: Medicare PPO | Attending: Oncology | Admitting: Oncology

## 2022-11-17 ENCOUNTER — Other Ambulatory Visit: Payer: Self-pay | Admitting: Oncology

## 2022-11-17 ENCOUNTER — Telehealth: Payer: Self-pay

## 2022-11-17 ENCOUNTER — Inpatient Hospital Stay: Payer: Medicare PPO

## 2022-11-17 VITALS — BP 180/77 | HR 81 | Temp 98.6°F | Resp 18 | Ht 63.0 in | Wt 295.8 lb

## 2022-11-17 DIAGNOSIS — D61818 Other pancytopenia: Secondary | ICD-10-CM | POA: Insufficient documentation

## 2022-11-17 DIAGNOSIS — D508 Other iron deficiency anemias: Secondary | ICD-10-CM

## 2022-11-17 DIAGNOSIS — D509 Iron deficiency anemia, unspecified: Secondary | ICD-10-CM | POA: Insufficient documentation

## 2022-11-17 DIAGNOSIS — R161 Splenomegaly, not elsewhere classified: Secondary | ICD-10-CM | POA: Diagnosis not present

## 2022-11-17 DIAGNOSIS — K7460 Unspecified cirrhosis of liver: Secondary | ICD-10-CM | POA: Diagnosis not present

## 2022-11-17 DIAGNOSIS — D6489 Other specified anemias: Secondary | ICD-10-CM

## 2022-11-17 LAB — CMP (CANCER CENTER ONLY)
ALT: 17 U/L (ref 0–44)
AST: 33 U/L (ref 15–41)
Albumin: 3.4 g/dL — ABNORMAL LOW (ref 3.5–5.0)
Alkaline Phosphatase: 135 U/L — ABNORMAL HIGH (ref 38–126)
Anion gap: 8 (ref 5–15)
BUN: 26 mg/dL — ABNORMAL HIGH (ref 8–23)
CO2: 28 mmol/L (ref 22–32)
Calcium: 8.9 mg/dL (ref 8.9–10.3)
Chloride: 103 mmol/L (ref 98–111)
Creatinine: 1.7 mg/dL — ABNORMAL HIGH (ref 0.44–1.00)
GFR, Estimated: 33 mL/min — ABNORMAL LOW (ref >60)
Glucose, Bld: 179 mg/dL — ABNORMAL HIGH (ref 70–99)
Potassium: 3.4 mmol/L — ABNORMAL LOW (ref 3.5–5.1)
Sodium: 139 mmol/L (ref 135–145)
Total Bilirubin: 1.5 mg/dL — ABNORMAL HIGH (ref 0.3–1.2)
Total Protein: 7.4 g/dL (ref 6.5–8.1)

## 2022-11-17 LAB — IRON AND TIBC
Iron: 46 ug/dL (ref 28–170)
Saturation Ratios: 12 % (ref 10.4–31.8)
TIBC: 392 ug/dL (ref 250–450)
UIBC: 346 ug/dL

## 2022-11-17 LAB — VITAMIN B12: Vitamin B-12: 365 pg/mL (ref 180–914)

## 2022-11-17 LAB — CBC AND DIFFERENTIAL
HCT: 28 — AB (ref 36–46)
Hemoglobin: 9.3 — AB (ref 12.0–16.0)
Neutrophils Absolute: 1.58
Platelets: 47 10*3/uL — AB (ref 150–400)
WBC: 2.4

## 2022-11-17 LAB — CBC: RBC: 3.14 — AB (ref 3.87–5.11)

## 2022-11-17 LAB — FOLATE: Folate: 7.5 ng/mL (ref >5.9)

## 2022-11-17 LAB — FERRITIN: Ferritin: 16 ng/mL (ref 11–307)

## 2022-11-17 NOTE — Telephone Encounter (Signed)
Latest Reference Range & Units 11/17/22 00:00 11/17/22 10:29 11/17/22 10:30  Iron 28 - 170 ug/dL   46  UIBC ug/dL   346  TIBC 250 - 450 ug/dL   392  Saturation Ratios 10.4 - 31.8 %   12  Ferritin 11 - 307 ng/mL   16  Folate >5.9 ng/mL   7.5  Vitamin B12 180 - 914 pg/mL   365  CBC W DIFFERENTIAL ([facility] SCANNED REPORT)   Rpt   WBC  2.4 (E)    RBC 3.87 - 5.11  3.14 ! (E)    Hemoglobin 12.0 - 16.0  9.3 ! (E)    HCT 36 - 46  28 ! (E)    Platelets 150 - 400 K/uL 47 ! (E)    !: Data is abnormal Rpt: View report in Results Review for more information (E): External lab result

## 2022-11-18 ENCOUNTER — Encounter: Payer: Self-pay | Admitting: Oncology

## 2022-11-19 ENCOUNTER — Encounter: Payer: Self-pay | Admitting: Oncology

## 2022-11-19 MED FILL — Iron Sucrose Inj 20 MG/ML (Fe Equiv): INTRAVENOUS | Qty: 10 | Status: AC

## 2022-11-20 ENCOUNTER — Inpatient Hospital Stay: Payer: Medicare PPO | Attending: Oncology

## 2022-11-20 VITALS — BP 142/54 | HR 70 | Temp 98.2°F | Resp 18 | Wt 305.0 lb

## 2022-11-20 DIAGNOSIS — D509 Iron deficiency anemia, unspecified: Secondary | ICD-10-CM | POA: Insufficient documentation

## 2022-11-20 MED ORDER — SODIUM CHLORIDE 0.9 % IV SOLN: Freq: Once | INTRAVENOUS | Status: AC

## 2022-11-20 MED ORDER — SODIUM CHLORIDE 0.9 % IV SOLN
200.0000 mg | Freq: Once | INTRAVENOUS | Status: AC
  Administered 2022-11-20: 200 mg via INTRAVENOUS
  Filled 2022-11-20: qty 200

## 2022-11-20 NOTE — Patient Instructions (Signed)

## 2022-11-23 MED FILL — Iron Sucrose Inj 20 MG/ML (Fe Equiv): INTRAVENOUS | Qty: 10 | Status: AC

## 2022-11-24 ENCOUNTER — Inpatient Hospital Stay: Payer: Medicare PPO

## 2022-11-24 VITALS — BP 178/77 | HR 86 | Temp 98.2°F | Resp 22

## 2022-11-24 DIAGNOSIS — D509 Iron deficiency anemia, unspecified: Secondary | ICD-10-CM

## 2022-11-24 MED ORDER — SODIUM CHLORIDE 0.9 % IV SOLN
200.0000 mg | Freq: Once | INTRAVENOUS | Status: AC
  Administered 2022-11-24: 200 mg via INTRAVENOUS
  Filled 2022-11-24: qty 200

## 2022-11-24 MED ORDER — SODIUM CHLORIDE 0.9 % IV SOLN: Freq: Once | INTRAVENOUS | Status: AC

## 2022-11-24 NOTE — Patient Instructions (Signed)

## 2022-11-26 ENCOUNTER — Encounter
Admit: 2022-11-26 | Discharge: 2022-11-26 | Payer: MEDICARE | Attending: Student in an Organized Health Care Education/Training Program | Primary: Student in an Organized Health Care Education/Training Program

## 2022-11-26 ENCOUNTER — Ambulatory Visit: Admit: 2022-11-26 | Discharge: 2022-11-26 | Payer: MEDICARE

## 2022-11-26 MED ORDER — OMEPRAZOLE 20 MG CAPSULE,DELAYED RELEASE
ORAL_CAPSULE | Freq: Every day | ORAL | 0 refills | 30 days | Status: CP
Start: 2022-11-26 — End: 2022-12-26

## 2022-11-26 MED ORDER — SPIRONOLACTONE 50 MG TABLET
ORAL_TABLET | Freq: Every day | ORAL | 0 refills | 30 days | Status: CP
Start: 2022-11-26 — End: 2022-12-26

## 2022-11-26 MED ORDER — TORSEMIDE 100 MG TABLET
ORAL_TABLET | Freq: Two times a day (BID) | ORAL | 0 refills | 30 days | Status: CP
Start: 2022-11-26 — End: 2022-12-26

## 2022-11-26 MED ADMIN — lactated Ringers infusion: 10 mL/h | INTRAVENOUS | @ 16:00:00 | Stop: 2022-11-26

## 2022-11-26 MED ADMIN — Propofol (DIPRIVAN) injection: INTRAVENOUS | @ 16:00:00 | Stop: 2022-11-26

## 2022-11-26 MED ADMIN — lidocaine (XYLOCAINE) 20 mg/mL (2 %) injection: INTRAVENOUS | @ 16:00:00 | Stop: 2022-11-26

## 2022-11-26 MED FILL — Iron Sucrose Inj 20 MG/ML (Fe Equiv): INTRAVENOUS | Qty: 10 | Status: AC

## 2022-11-27 ENCOUNTER — Inpatient Hospital Stay: Payer: Medicare PPO

## 2022-11-27 VITALS — BP 137/42 | HR 75 | Temp 98.2°F | Resp 16 | Ht 63.0 in | Wt 305.1 lb

## 2022-11-27 DIAGNOSIS — D509 Iron deficiency anemia, unspecified: Secondary | ICD-10-CM

## 2022-11-27 MED ORDER — SODIUM CHLORIDE 0.9 % IV SOLN
200.0000 mg | Freq: Once | INTRAVENOUS | Status: AC
  Administered 2022-11-27: 200 mg via INTRAVENOUS
  Filled 2022-11-27: qty 200

## 2022-11-27 MED ORDER — SODIUM CHLORIDE 0.9 % IV SOLN: Freq: Once | INTRAVENOUS | Status: AC

## 2022-11-27 MED FILL — Iron Sucrose Inj 20 MG/ML (Fe Equiv): INTRAVENOUS | Qty: 10 | Status: AC

## 2022-11-27 NOTE — Patient Instructions (Signed)

## 2022-11-30 ENCOUNTER — Inpatient Hospital Stay: Payer: Medicare PPO

## 2022-11-30 VITALS — BP 129/55 | HR 72 | Temp 98.0°F | Resp 18

## 2022-11-30 DIAGNOSIS — D509 Iron deficiency anemia, unspecified: Secondary | ICD-10-CM

## 2022-11-30 MED ORDER — SODIUM CHLORIDE 0.9 % IV SOLN
200.0000 mg | Freq: Once | INTRAVENOUS | Status: AC
  Administered 2022-11-30: 200 mg via INTRAVENOUS
  Filled 2022-11-30: qty 200

## 2022-11-30 MED ORDER — SODIUM CHLORIDE 0.9 % IV SOLN: Freq: Once | INTRAVENOUS | Status: AC

## 2022-11-30 NOTE — Patient Instructions (Signed)

## 2022-12-01 MED FILL — Iron Sucrose Inj 20 MG/ML (Fe Equiv): INTRAVENOUS | Qty: 10 | Status: AC

## 2022-12-02 ENCOUNTER — Inpatient Hospital Stay: Payer: Medicare PPO

## 2022-12-02 VITALS — BP 153/61 | HR 72 | Temp 97.6°F | Resp 16

## 2022-12-02 DIAGNOSIS — D509 Iron deficiency anemia, unspecified: Secondary | ICD-10-CM

## 2022-12-02 MED ORDER — SODIUM CHLORIDE 0.9 % IV SOLN
200.0000 mg | Freq: Once | INTRAVENOUS | Status: AC
  Administered 2022-12-02: 200 mg via INTRAVENOUS
  Filled 2022-12-02: qty 200

## 2022-12-02 MED ORDER — SODIUM CHLORIDE 0.9 % IV SOLN: Freq: Once | INTRAVENOUS | Status: AC

## 2022-12-02 NOTE — Patient Instructions (Signed)

## 2022-12-10 ENCOUNTER — Ambulatory Visit: Admit: 2022-12-10 | Discharge: 2022-12-11 | Payer: MEDICARE

## 2022-12-10 LAB — BASIC METABOLIC PANEL
ANION GAP: 2 mmol/L — ABNORMAL LOW (ref 7–15)
BLOOD UREA NITROGEN: 28 mg/dL — ABNORMAL HIGH (ref 7–21)
BUN / CREAT RATIO: 22
CALCIUM: 9 mg/dL (ref 8.5–10.2)
CHLORIDE: 109 mmol/L — ABNORMAL HIGH (ref 98–107)
CO2: 28 mmol/L (ref 22.0–32.0)
CREATININE: 1.3 mg/dL — ABNORMAL HIGH (ref 0.60–1.00)
EGFR CKD-EPI (2021) FEMALE: 46 mL/min/{1.73_m2} — ABNORMAL LOW (ref >=60)
GLUCOSE RANDOM: 178 mg/dL — ABNORMAL HIGH (ref 74–106)
POTASSIUM: 3.8 mmol/L (ref 3.5–5.0)
SODIUM: 139 mmol/L (ref 135–145)

## 2022-12-11 DIAGNOSIS — K7682 Hepatic encephalopathy (CMS-HCC): Principal | ICD-10-CM

## 2022-12-11 NOTE — Unmapped (Signed)
Specialty Medication(s): Xifaxan 550mg     Ms.Heckstall has been dis-enrolled from the [facility] specialty pharmacy services due to enrollment in a manufacturer assistance program that sends medicine directly to the patient.    Xifaxan will be made available through the manufacturer's free drug program and will be shipped directly to the patient via the manufacturer's contracted specialty pharmacy.       A representative from the manufacturer program will reach out to the patient to schedule a delivery.  Patient can follow up with manufacturer if needed via phone# @ [phone].     In the event of a dose change for the approved medication or if additional refills are needed the clinic team can contact the following:  Dispensing Pharmacy:  [facility]  Phone Number: [phone]  Fax Number: [phone]     MAP Team Communication Attempts to Patient:  MAP team left a  voicemail for patient including MAP Specialist call back information. If patient returns phone call, information will be relayed at that time; referral authorized based on assistance approval.    Additional information provided to the patient: n/a    [name], PharmD  [facility] Specialty Pharmacist

## 2023-01-12 ENCOUNTER — Ambulatory Visit: Payer: Medicare PPO | Admitting: General Practice

## 2023-01-12 ENCOUNTER — Ambulatory Visit: Admit: 2023-01-12 | Discharge: 2023-01-13 | Payer: MEDICARE

## 2023-01-12 DIAGNOSIS — I81 Portal vein thrombosis: Principal | ICD-10-CM

## 2023-01-12 DIAGNOSIS — K862 Cyst of pancreas: Principal | ICD-10-CM

## 2023-01-12 DIAGNOSIS — K7581 Nonalcoholic steatohepatitis (NASH): Principal | ICD-10-CM

## 2023-01-12 DIAGNOSIS — K7460 Unspecified cirrhosis of liver: Principal | ICD-10-CM

## 2023-01-12 DIAGNOSIS — K766 Portal hypertension: Principal | ICD-10-CM

## 2023-01-12 MED ORDER — LACTULOSE 20 GRAM/30 ML ORAL SOLUTION
Freq: Two times a day (BID) | ORAL | 11 refills | 30 days | Status: CP
Start: 2023-01-12 — End: 2024-01-12

## 2023-01-14 ENCOUNTER — Other Ambulatory Visit: Payer: Self-pay | Admitting: Physician Assistant

## 2023-01-14 DIAGNOSIS — K862 Cyst of pancreas: Secondary | ICD-10-CM

## 2023-01-16 ENCOUNTER — Emergency Department: Admit: 2023-01-16 | Discharge: 2023-01-16 | Disposition: A | Discharge status: Home / Self Care | Payer: MEDICARE

## 2023-01-16 ENCOUNTER — Ambulatory Visit: Admit: 2023-01-16 | Discharge: 2023-01-16 | Disposition: A | Discharge status: Home / Self Care | Payer: MEDICARE

## 2023-01-16 DIAGNOSIS — J069 Acute upper respiratory infection, unspecified: Principal | ICD-10-CM

## 2023-02-05 ENCOUNTER — Ambulatory Visit
Admission: RE | Admit: 2023-02-05 | Discharge: 2023-02-05 | Disposition: A | Discharge status: Home / Self Care | Payer: Medicare PPO | Source: Ambulatory Visit | Attending: Physician Assistant | Admitting: Physician Assistant

## 2023-02-05 DIAGNOSIS — K862 Cyst of pancreas: Secondary | ICD-10-CM

## 2023-02-05 MED ORDER — GADOPICLENOL 0.5 MMOL/ML IV SOLN
10.0000 mL | Freq: Once | INTRAVENOUS | Status: AC | PRN
  Administered 2023-02-05: 10 mL via INTRAVENOUS

## 2023-02-09 DIAGNOSIS — K7581 Nonalcoholic steatohepatitis (NASH): Principal | ICD-10-CM

## 2023-02-09 DIAGNOSIS — K7460 Unspecified cirrhosis of liver: Principal | ICD-10-CM

## 2023-02-09 DIAGNOSIS — R188 Other ascites: Principal | ICD-10-CM

## 2023-02-10 ENCOUNTER — Ambulatory Visit: Admit: 2023-02-10 | Discharge: 2023-02-11 | Payer: MEDICARE

## 2023-02-10 DIAGNOSIS — K7460 Unspecified cirrhosis of liver: Principal | ICD-10-CM

## 2023-02-10 DIAGNOSIS — K7581 Nonalcoholic steatohepatitis (NASH): Principal | ICD-10-CM

## 2023-02-10 DIAGNOSIS — R188 Other ascites: Principal | ICD-10-CM
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
## 2023-02-17 ENCOUNTER — Ambulatory Visit: Payer: Medicare PPO | Attending: General Practice | Admitting: General Practice

## 2023-02-17 ENCOUNTER — Encounter: Payer: Self-pay | Admitting: General Practice

## 2023-02-17 VITALS — BP 138/52 | HR 78 | Ht 63.0 in | Wt 303.0 lb

## 2023-02-17 DIAGNOSIS — K7581 Nonalcoholic steatohepatitis (NASH): Secondary | ICD-10-CM | POA: Diagnosis not present

## 2023-02-17 DIAGNOSIS — I10 Essential (primary) hypertension: Secondary | ICD-10-CM | POA: Diagnosis not present

## 2023-02-17 DIAGNOSIS — R001 Bradycardia, unspecified: Secondary | ICD-10-CM | POA: Diagnosis not present

## 2023-02-17 DIAGNOSIS — I428 Other cardiomyopathies: Secondary | ICD-10-CM | POA: Diagnosis not present

## 2023-02-17 DIAGNOSIS — K7460 Unspecified cirrhosis of liver: Secondary | ICD-10-CM

## 2023-02-17 NOTE — Patient Instructions (Signed)
Medication Instructions:   Your physician recommends that you continue on your current medications as directed. Please refer to the Current Medication list given to you today.  *If you need a refill on your cardiac medications before your next appointment, please call your pharmacy*  Lab Work: NONE ordered at this time of appointment   If you have labs (blood work) drawn today and your tests are completely normal, you will receive your results only by: MyChart Message (if you have MyChart) OR A paper copy in the mail If you have any lab test that is abnormal or we need to change your treatment, we will call you to review the results.  Testing/Procedures: NONE ordered at this time of appointment   Follow-Up: At [facility], you and your health needs are our priority.  As part of our continuing mission to provide you with exceptional heart care, we have created designated Provider Care Teams.  These Care Teams include your primary Cardiologist (physician) and Advanced Practice Providers (APPs -  Physician Assistants and Nurse Practitioners) who all work together to provide you with the care you need, when you need it.  We recommend signing up for the patient portal called "MyChart".  Sign up information is provided on this After Visit Summary.  MyChart is used to connect with patients for Virtual Visits (Telemedicine).  Patients are able to view lab/test results, encounter notes, upcoming appointments, etc.  Non-urgent messages can be sent to your provider as well.   To learn more about what you can do with MyChart, go to [email or web address].    Your next appointment:   6 month(s)  Provider:   Peter Swaziland, MD     Other Instructions Increase physical activity as tolerated

## 2023-02-18 ENCOUNTER — Ambulatory Visit: Admit: 2023-02-18 | Discharge: 2023-02-18 | Payer: MEDICARE

## 2023-02-18 DIAGNOSIS — R188 Other ascites: Principal | ICD-10-CM

## 2023-02-18 DIAGNOSIS — K7460 Unspecified cirrhosis of liver: Principal | ICD-10-CM

## 2023-02-18 DIAGNOSIS — K7581 Nonalcoholic steatohepatitis (NASH): Principal | ICD-10-CM

## 2023-02-19 DIAGNOSIS — R188 Other ascites: Principal | ICD-10-CM

## 2023-02-22 DIAGNOSIS — K7581 Nonalcoholic steatohepatitis (NASH): Principal | ICD-10-CM

## 2023-02-22 DIAGNOSIS — K7460 Unspecified cirrhosis of liver: Principal | ICD-10-CM

## 2023-02-22 DIAGNOSIS — R188 Other ascites: Principal | ICD-10-CM

## 2023-02-22 DIAGNOSIS — I5042 Chronic combined systolic (congestive) and diastolic (congestive) heart failure: Principal | ICD-10-CM

## 2023-02-23 DIAGNOSIS — R188 Other ascites: Principal | ICD-10-CM

## 2023-02-23 DIAGNOSIS — I5040 Unspecified combined systolic (congestive) and diastolic (congestive) heart failure: Principal | ICD-10-CM

## 2023-02-23 DIAGNOSIS — I5042 Chronic combined systolic (congestive) and diastolic (congestive) heart failure: Principal | ICD-10-CM

## 2023-03-17 NOTE — Progress Notes (Signed)
[facility]  [address] [place],  [place]  [address] [phone]  Clinic Day:  03/18/2023  Referring physician: Hadley Pen, MD   HISTORY OF PRESENT ILLNESS:  The patient is a 67 y.o. 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  Furthermore, she has had iron deficiency anemia, for which she recently received another course IV iron to replenish her iron stores and improve her hemoglobin.  She comes in today to have her peripheral counts reassessed.  As it pertains to her baseline liver disease, she was recently seen at [facility] by hepatology.  It appears a TIPS procedure is being contemplated due to her recurrent ascites requiring repeat paracenteses.  She claims to still have occasional black stools.  Of note, she has had esophageal varices that required banding in the past.    PHYSICAL EXAM:  Blood pressure (!) 196/79, pulse 73, temperature 97.8 F (36.6 C), resp. rate 16, height 5\' 3"  (1.6 m), weight 296 lb 9.6 oz (134.5 kg), SpO2 96 %. Wt Readings from Last 3 Encounters:  03/18/23 296 lb 9.6 oz (134.5 kg)  02/17/23 (!) 303 lb (137.4 kg)  11/27/22 (!) 305 lb 1.9 oz (138.4 kg)   Body mass index is 52.54 kg/m. Performance status (ECOG): 1 Physical Exam Constitutional:      Appearance: Normal appearance. She is not ill-appearing.  HENT:     Mouth/Throat:     Mouth: Mucous membranes are moist.     Pharynx: Oropharynx is clear. No oropharyngeal exudate or posterior oropharyngeal erythema.  Cardiovascular:     Rate and Rhythm: Normal rate and regular rhythm.     Heart sounds: No murmur heard.    No friction rub. No gallop.  Pulmonary:     Effort: Pulmonary effort is normal. No respiratory distress.     Breath sounds: Normal breath sounds. No wheezing, rhonchi or rales.  Abdominal:     General: Bowel sounds are normal. There is no distension.     Palpations: Abdomen is soft. There is  no mass.     Tenderness: There is no abdominal tenderness.  Musculoskeletal:        General: No swelling.     Right lower leg: No edema.     Left lower leg: No edema.  Lymphadenopathy:     Cervical: No cervical adenopathy.     Upper Body:     Right upper body: No supraclavicular or axillary adenopathy.     Left upper body: No supraclavicular or axillary adenopathy.     Lower Body: No right inguinal adenopathy. No left inguinal adenopathy.  Skin:    General: Skin is warm.     Coloration: Skin is not jaundiced.     Findings: No lesion or rash.  Neurological:     General: No focal deficit present.     Mental Status: She is alert and oriented to person, place, and time. Mental status is at baseline.  Psychiatric:        Mood and Affect: Mood normal.        Behavior: Behavior normal.        Thought Content: Thought content normal.   LABS:      Latest Reference Range & Units 03/18/23 10:11  Sodium 135 - 145 mmol/L 140  Potassium 3.5 - 5.1 mmol/L 4.0  Chloride 98 - 111 mmol/L 102  CO2 22 - 32 mmol/L 29  Glucose 70 - 99 mg/dL 914 (H)  BUN 8 - 23 mg/dL 34 (H)  Creatinine 3.66 - 1.00 mg/dL 4.40 (H)  Calcium 8.9 - 10.3 mg/dL 8.9  Anion gap 5 - 15  9  Alkaline Phosphatase 38 - 126 U/L 142 (H)  Albumin 3.5 - 5.0 g/dL 3.1 (L)  AST 15 - 41 U/L 29  ALT 0 - 44 U/L 19  Total Protein 6.5 - 8.1 g/dL 7.3  Total Bilirubin 0.3 - 1.2 mg/dL 1.3 (H)  GFR, Est Non African American >60 mL/min 40 (L)  (H): Data is abnormally high (L): Data is abnormally low   Latest Reference Range & Units 03/18/23 10:11  Iron 28 - 170 ug/dL 56  UIBC ug/dL 347  TIBC 425 - 956 ug/dL 387  Saturation Ratios 10.4 - 31.8 % 16  Ferritin 11 - 307 ng/mL 33    ASSESSMENT & PLAN:  A 67 y.o. female with pancytopenia secondary to NASH cirrhosis and secondary splenomegaly.  She also has iron deficiency anemia.  When comparing her labs today to previously, they are essentially unchanged, despite having received IV iron  recently.  Her iron parameters are fine.  This further highlights how significant her cirrhosis and splenomegaly are impacting her peripheral counts.  She understands her pancytopenia will likely be a chronic issue due to her cirrhosis.  It will remain important for her to follow up closely with hepatology to do whatever necessary to offset the severity of her NASH cirrhosis/end-stage liver disease.  Otherwise, I will see her back in 6 months for repeat clinical assessment.  The patient understands all the plans discussed today and is in agreement with them.  Sarim Rothman Kirby Funk, MD

## 2023-03-18 ENCOUNTER — Other Ambulatory Visit: Payer: Self-pay | Admitting: Oncology

## 2023-03-18 ENCOUNTER — Telehealth: Payer: Self-pay

## 2023-03-18 ENCOUNTER — Inpatient Hospital Stay: Payer: Medicare PPO

## 2023-03-18 ENCOUNTER — Inpatient Hospital Stay: Payer: Medicare PPO | Attending: Oncology | Admitting: Oncology

## 2023-03-18 VITALS — BP 196/79 | HR 73 | Temp 97.8°F | Resp 16 | Ht 63.0 in | Wt 296.6 lb

## 2023-03-18 DIAGNOSIS — D61818 Other pancytopenia: Secondary | ICD-10-CM

## 2023-03-18 DIAGNOSIS — D509 Iron deficiency anemia, unspecified: Secondary | ICD-10-CM | POA: Insufficient documentation

## 2023-03-18 DIAGNOSIS — K7460 Unspecified cirrhosis of liver: Secondary | ICD-10-CM

## 2023-03-18 DIAGNOSIS — K7581 Nonalcoholic steatohepatitis (NASH): Secondary | ICD-10-CM | POA: Insufficient documentation

## 2023-03-18 LAB — CMP (CANCER CENTER ONLY)
ALT: 19 U/L (ref 0–44)
AST: 29 U/L (ref 15–41)
Albumin: 3.1 g/dL — ABNORMAL LOW (ref 3.5–5.0)
Alkaline Phosphatase: 142 U/L — ABNORMAL HIGH (ref 38–126)
Anion gap: 9 (ref 5–15)
BUN: 34 mg/dL — ABNORMAL HIGH (ref 8–23)
CO2: 29 mmol/L (ref 22–32)
Calcium: 8.9 mg/dL (ref 8.9–10.3)
Chloride: 102 mmol/L (ref 98–111)
Creatinine: 1.45 mg/dL — ABNORMAL HIGH (ref 0.44–1.00)
GFR, Estimated: 40 mL/min — ABNORMAL LOW (ref >60)
Glucose, Bld: 205 mg/dL — ABNORMAL HIGH (ref 70–99)
Potassium: 4 mmol/L (ref 3.5–5.1)
Sodium: 140 mmol/L (ref 135–145)
Total Bilirubin: 1.3 mg/dL — ABNORMAL HIGH (ref 0.3–1.2)
Total Protein: 7.3 g/dL (ref 6.5–8.1)

## 2023-03-18 LAB — FERRITIN: Ferritin: 33 ng/mL (ref 11–307)

## 2023-03-18 LAB — CBC AND DIFFERENTIAL
HCT: 27 — AB (ref 36–46)
Hemoglobin: 9.2 — AB (ref 12.0–16.0)
Neutrophils Absolute: 1.89
Platelets: 49 10*3/uL — AB (ref 150–400)
WBC: 2.7

## 2023-03-18 LAB — IRON AND TIBC
Iron: 56 ug/dL (ref 28–170)
Saturation Ratios: 16 % (ref 10.4–31.8)
TIBC: 356 ug/dL (ref 250–450)
UIBC: 300 ug/dL

## 2023-03-18 LAB — CBC: RBC: 2.95 — AB (ref 3.87–5.11)

## 2023-03-18 NOTE — Progress Notes (Signed)
Pt has recently had a Paracentesis due to cirrhosis of Liver.  Will be having an Echo to see if her heart is strong enough to undergo placement of stent in portal vein.

## 2023-03-18 NOTE — Telephone Encounter (Signed)
Per Dr. Melvyn Neth:  No longer iron deficient, does not need iron at this time.

## 2023-03-20 DIAGNOSIS — K7460 Unspecified cirrhosis of liver: Principal | ICD-10-CM

## 2023-03-20 DIAGNOSIS — R188 Other ascites: Principal | ICD-10-CM

## 2023-03-20 DIAGNOSIS — I509 Heart failure, unspecified: Principal | ICD-10-CM

## 2023-03-20 DIAGNOSIS — K7581 Nonalcoholic steatohepatitis (NASH): Principal | ICD-10-CM

## 2023-03-29 ENCOUNTER — Ambulatory Visit: Admit: 2023-03-29 | Discharge: 2023-03-30 | Payer: MEDICARE

## 2023-04-07 ENCOUNTER — Ambulatory Visit: Admit: 2023-04-07 | Discharge: 2023-04-08 | Payer: MEDICARE

## 2023-04-16 ENCOUNTER — Other Ambulatory Visit: Payer: Self-pay

## 2023-04-16 MED ORDER — TORSEMIDE 100 MG PO TABS: 100.0000 mg | ORAL_TABLET | Freq: Every day | ORAL | 3 refills | Status: DC

## 2023-04-26 ENCOUNTER — Ambulatory Visit: Admit: 2023-04-26 | Discharge: 2023-04-27 | Payer: MEDICARE

## 2023-04-26 DIAGNOSIS — K31819 Angiodysplasia of stomach and duodenum without bleeding: Principal | ICD-10-CM

## 2023-04-26 DIAGNOSIS — I8510 Secondary esophageal varices without bleeding: Principal | ICD-10-CM

## 2023-04-26 DIAGNOSIS — K766 Portal hypertension: Principal | ICD-10-CM

## 2023-04-26 DIAGNOSIS — K7469 Other cirrhosis of liver: Principal | ICD-10-CM

## 2023-04-26 DIAGNOSIS — K921 Melena: Principal | ICD-10-CM

## 2023-04-28 ENCOUNTER — Ambulatory Visit: Admit: 2023-04-28 | Discharge: 2023-04-28 | Payer: MEDICARE

## 2023-04-28 DIAGNOSIS — K7469 Other cirrhosis of liver: Principal | ICD-10-CM

## 2023-04-28 DIAGNOSIS — R600 Localized edema: Principal | ICD-10-CM

## 2023-04-28 DIAGNOSIS — R188 Other ascites: Principal | ICD-10-CM

## 2023-04-29 ENCOUNTER — Ambulatory Visit: Admit: 2023-04-29 | Discharge: 2023-05-03 | Payer: MEDICARE

## 2023-05-03 DIAGNOSIS — R188 Other ascites: Principal | ICD-10-CM

## 2023-05-03 DIAGNOSIS — K7469 Other cirrhosis of liver: Principal | ICD-10-CM

## 2023-05-04 DIAGNOSIS — R188 Other ascites: Principal | ICD-10-CM

## 2023-05-04 DIAGNOSIS — K7581 Nonalcoholic steatohepatitis (NASH): Principal | ICD-10-CM

## 2023-05-04 DIAGNOSIS — K7469 Other cirrhosis of liver: Principal | ICD-10-CM

## 2023-05-05 ENCOUNTER — Encounter
Admit: 2023-05-05 | Discharge: 2023-05-05 | Payer: MEDICARE | Attending: Certified Registered" | Primary: Certified Registered"

## 2023-05-05 ENCOUNTER — Ambulatory Visit: Admit: 2023-05-05 | Discharge: 2023-05-05 | Payer: MEDICARE

## 2023-05-12 ENCOUNTER — Ambulatory Visit: Admit: 2023-05-12 | Discharge: 2023-05-12 | Payer: MEDICARE

## 2023-05-27 ENCOUNTER — Ambulatory Visit: Admit: 2023-05-27 | Discharge: 2023-05-27 | Payer: MEDICARE

## 2023-05-27 DIAGNOSIS — R188 Other ascites: Principal | ICD-10-CM

## 2023-06-08 DIAGNOSIS — K921 Melena: Principal | ICD-10-CM

## 2023-06-08 DIAGNOSIS — K766 Portal hypertension: Principal | ICD-10-CM

## 2023-06-08 DIAGNOSIS — K31819 Angiodysplasia of stomach and duodenum without bleeding: Principal | ICD-10-CM

## 2023-06-08 DIAGNOSIS — D500 Iron deficiency anemia secondary to blood loss (chronic): Principal | ICD-10-CM

## 2023-06-10 ENCOUNTER — Ambulatory Visit: Admit: 2023-06-10 | Discharge: 2023-06-10 | Payer: MEDICARE

## 2023-06-10 DIAGNOSIS — K7460 Unspecified cirrhosis of liver: Principal | ICD-10-CM

## 2023-06-10 DIAGNOSIS — R188 Other ascites: Principal | ICD-10-CM

## 2023-06-10 DIAGNOSIS — K7581 Nonalcoholic steatohepatitis (NASH): Principal | ICD-10-CM

## 2023-06-11 DIAGNOSIS — D509 Iron deficiency anemia, unspecified: Principal | ICD-10-CM

## 2023-06-11 MED ORDER — FERUMOXYTOL (FERAHEME) 510 MG IN MBP 100 ML
INTRAVENOUS | 1 refills | 0 days | Status: CP
Start: 2023-06-11 — End: 2023-06-19

## 2023-06-24 ENCOUNTER — Ambulatory Visit: Admit: 2023-06-24 | Discharge: 2023-06-24 | Payer: MEDICARE

## 2023-06-24 DIAGNOSIS — R188 Other ascites: Principal | ICD-10-CM

## 2023-07-05 DIAGNOSIS — K862 Cyst of pancreas: Principal | ICD-10-CM

## 2023-07-05 DIAGNOSIS — I81 Portal vein thrombosis: Principal | ICD-10-CM

## 2023-07-05 DIAGNOSIS — K7469 Other cirrhosis of liver: Principal | ICD-10-CM

## 2023-07-07 DIAGNOSIS — K7469 Other cirrhosis of liver: Principal | ICD-10-CM

## 2023-07-15 ENCOUNTER — Ambulatory Visit: Admit: 2023-07-15 | Discharge: 2023-07-15 | Payer: MEDICARE

## 2023-07-15 DIAGNOSIS — K7469 Other cirrhosis of liver: Principal | ICD-10-CM

## 2023-07-15 DIAGNOSIS — R188 Other ascites: Principal | ICD-10-CM

## 2023-07-23 ENCOUNTER — Ambulatory Visit: Admit: 2023-07-23 | Discharge: 2023-07-23 | Payer: MEDICARE

## 2023-08-02 ENCOUNTER — Ambulatory Visit: Admit: 2023-08-02 | Discharge: 2023-08-03 | Payer: MEDICARE

## 2023-08-02 DIAGNOSIS — K7682 Hepatic encephalopathy (CMS-HCC): Principal | ICD-10-CM

## 2023-08-02 DIAGNOSIS — Z1159 Encounter for screening for other viral diseases: Principal | ICD-10-CM

## 2023-08-02 DIAGNOSIS — K7469 Other cirrhosis of liver: Principal | ICD-10-CM

## 2023-08-02 DIAGNOSIS — K31819 Angiodysplasia of stomach and duodenum without bleeding: Principal | ICD-10-CM

## 2023-08-02 DIAGNOSIS — I8511 Secondary esophageal varices with bleeding: Principal | ICD-10-CM

## 2023-08-02 DIAGNOSIS — D500 Iron deficiency anemia secondary to blood loss (chronic): Principal | ICD-10-CM

## 2023-08-02 MED ORDER — RIFAXIMIN 550 MG TABLET
ORAL_TABLET | Freq: Two times a day (BID) | ORAL | 0 refills | 30 days | Status: CP
Start: 2023-08-02 — End: ?

## 2023-08-05 ENCOUNTER — Ambulatory Visit: Admit: 2023-08-05 | Discharge: 2023-08-05 | Payer: MEDICARE

## 2023-08-05 DIAGNOSIS — R188 Other ascites: Principal | ICD-10-CM

## 2023-08-05 DIAGNOSIS — K7469 Other cirrhosis of liver: Principal | ICD-10-CM

## 2023-08-09 DIAGNOSIS — K7682 Hepatic encephalopathy (CMS-HCC): Principal | ICD-10-CM

## 2023-08-10 ENCOUNTER — Other Ambulatory Visit: Payer: Self-pay | Admitting: Physician Assistant

## 2023-08-10 DIAGNOSIS — K7469 Other cirrhosis of liver: Secondary | ICD-10-CM

## 2023-08-10 DIAGNOSIS — K862 Cyst of pancreas: Secondary | ICD-10-CM

## 2023-08-10 DIAGNOSIS — I81 Portal vein thrombosis: Secondary | ICD-10-CM

## 2023-08-11 ENCOUNTER — Other Ambulatory Visit: Payer: Self-pay | Admitting: Oncology

## 2023-08-11 ENCOUNTER — Inpatient Hospital Stay: Payer: Medicare PPO | Attending: Oncology

## 2023-08-11 DIAGNOSIS — D509 Iron deficiency anemia, unspecified: Secondary | ICD-10-CM | POA: Insufficient documentation

## 2023-08-11 DIAGNOSIS — K7581 Nonalcoholic steatohepatitis (NASH): Secondary | ICD-10-CM | POA: Diagnosis not present

## 2023-08-11 DIAGNOSIS — D61818 Other pancytopenia: Secondary | ICD-10-CM

## 2023-08-11 LAB — CBC WITH DIFFERENTIAL (CANCER CENTER ONLY)
Abs Immature Granulocytes: 0.01 10*3/uL (ref 0.00–0.07)
Basophils Absolute: 0 10*3/uL (ref 0.0–0.1)
Basophils Relative: 0 %
Eosinophils Absolute: 0.1 10*3/uL (ref 0.0–0.5)
Eosinophils Relative: 3 %
HCT: 25.6 % — ABNORMAL LOW (ref 36.0–46.0)
Hemoglobin: 8 g/dL — ABNORMAL LOW (ref 12.0–15.0)
Immature Granulocytes: 0 %
Immature Platelet Fraction: 3.9 % (ref 1.2–8.6)
Lymphocytes Relative: 17 %
Lymphs Abs: 0.4 10*3/uL — ABNORMAL LOW (ref 0.7–4.0)
MCH: 28 pg (ref 26.0–34.0)
MCHC: 31.3 g/dL (ref 30.0–36.0)
MCV: 89.5 fL (ref 80.0–100.0)
Monocytes Absolute: 0.2 10*3/uL (ref 0.1–1.0)
Monocytes Relative: 10 %
Neutro Abs: 1.7 10*3/uL (ref 1.7–7.7)
Neutrophils Relative %: 70 %
Platelet Count: 43 10*3/uL — ABNORMAL LOW (ref 150–400)
RBC: 2.86 MIL/uL — ABNORMAL LOW (ref 3.87–5.11)
RDW: 16.3 % — ABNORMAL HIGH (ref 11.5–15.5)
WBC Count: 2.4 10*3/uL — ABNORMAL LOW (ref 4.0–10.5)
nRBC: 0 % (ref 0.0–0.2)
nRBC: 0 /100{WBCs}

## 2023-08-18 ENCOUNTER — Ambulatory Visit: Admit: 2023-08-18 | Discharge: 2023-08-18 | Payer: MEDICARE

## 2023-08-18 DIAGNOSIS — R188 Other ascites: Principal | ICD-10-CM

## 2023-08-18 DIAGNOSIS — K7469 Other cirrhosis of liver: Principal | ICD-10-CM

## 2023-08-23 ENCOUNTER — Encounter: Admit: 2023-08-23 | Discharge: 2023-08-23 | Payer: MEDICARE | Attending: Anesthesiology | Primary: Anesthesiology

## 2023-08-23 ENCOUNTER — Ambulatory Visit: Admit: 2023-08-23 | Discharge: 2023-08-23 | Payer: MEDICARE

## 2023-09-03 ENCOUNTER — Other Ambulatory Visit: Payer: Medicare PPO

## 2023-09-03 ENCOUNTER — Ambulatory Visit
Admit: 2023-09-03 | Discharge: 2023-09-03 | Disposition: A | Discharge status: Other Acute Inpatient Hospital | Payer: MEDICARE | Attending: Student in an Organized Health Care Education/Training Program

## 2023-09-03 ENCOUNTER — Encounter
Admit: 2023-09-03 | Discharge: 2023-09-03 | Payer: MEDICARE | Attending: Student in an Organized Health Care Education/Training Program | Primary: Student in an Organized Health Care Education/Training Program

## 2023-09-03 ENCOUNTER — Ambulatory Visit: Admit: 2023-09-03 | Discharge: 2023-09-03 | Payer: MEDICARE

## 2023-09-03 ENCOUNTER — Emergency Department
Admit: 2023-09-03 | Discharge: 2023-09-03 | Disposition: A | Discharge status: Other Acute Inpatient Hospital | Payer: MEDICARE | Attending: Student in an Organized Health Care Education/Training Program

## 2023-09-03 DIAGNOSIS — R58 Hemorrhage, not elsewhere classified: Principal | ICD-10-CM

## 2023-09-07 ENCOUNTER — Ambulatory Visit
Admit: 2023-09-07 | Discharge: 2023-09-10 | Disposition: A | Discharge status: Home / Self Care | Payer: MEDICARE | Admitting: Student in an Organized Health Care Education/Training Program

## 2023-09-07 ENCOUNTER — Ambulatory Visit: Admit: 2023-09-07 | Discharge: 2023-09-10 | Payer: MEDICARE

## 2023-09-10 ENCOUNTER — Ambulatory Visit: Payer: Medicare PPO | Admitting: Cardiology

## 2023-09-10 MED ORDER — AMOXICILLIN 875 MG-POTASSIUM CLAVULANATE 125 MG TABLET
ORAL_TABLET | Freq: Two times a day (BID) | ORAL | 0 refills | 7.00 days | Status: CP
Start: 2023-09-10 — End: 2023-09-17
  Filled 2023-09-10: qty 14, 7d supply, fill #0

## 2023-09-13 ENCOUNTER — Ambulatory Visit: Admit: 2023-09-13 | Discharge: 2023-09-13 | Payer: MEDICARE

## 2023-09-13 DIAGNOSIS — K7469 Other cirrhosis of liver: Principal | ICD-10-CM

## 2023-09-13 DIAGNOSIS — R188 Other ascites: Principal | ICD-10-CM

## 2023-09-16 ENCOUNTER — Other Ambulatory Visit: Payer: Self-pay | Admitting: Oncology

## 2023-09-16 ENCOUNTER — Other Ambulatory Visit: Payer: Self-pay

## 2023-09-16 DIAGNOSIS — D509 Iron deficiency anemia, unspecified: Secondary | ICD-10-CM

## 2023-09-16 NOTE — Progress Notes (Deleted)
[facility]  [address] [place],  [place]  [address] [phone]  Clinic Day:  03/18/2023  Referring physician: Hadley Pen, MD   HISTORY OF PRESENT ILLNESS:  The patient is a 67 y.o. 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  Furthermore, she has had iron deficiency anemia, for which she recently received another course IV iron to replenish her iron stores and improve her hemoglobin.  She comes in today to have her peripheral counts reassessed.  As it pertains to her baseline liver disease, she was recently seen at [facility] by hepatology.  It appears a TIPS procedure is being contemplated due to her recurrent ascites requiring repeat paracenteses.  She claims to still have occasional black stools.  Of note, she has had esophageal varices that required banding in the past.    PHYSICAL EXAM:  There were no vitals taken for this visit. Wt Readings from Last 3 Encounters:  03/18/23 296 lb 9.6 oz (134.5 kg)  02/17/23 (!) 303 lb (137.4 kg)  11/27/22 (!) 305 lb 1.9 oz (138.4 kg)   There is no height or weight on file to calculate BMI. Performance status (ECOG): 1 Physical Exam Constitutional:      Appearance: Normal appearance. She is not ill-appearing.  HENT:     Mouth/Throat:     Mouth: Mucous membranes are moist.     Pharynx: Oropharynx is clear. No oropharyngeal exudate or posterior oropharyngeal erythema.  Cardiovascular:     Rate and Rhythm: Normal rate and regular rhythm.     Heart sounds: No murmur heard.    No friction rub. No gallop.  Pulmonary:     Effort: Pulmonary effort is normal. No respiratory distress.     Breath sounds: Normal breath sounds. No wheezing, rhonchi or rales.  Abdominal:     General: Bowel sounds are normal. There is no distension.     Palpations: Abdomen is soft. There is no mass.     Tenderness: There is no abdominal tenderness.  Musculoskeletal:         General: No swelling.     Right lower leg: No edema.     Left lower leg: No edema.  Lymphadenopathy:     Cervical: No cervical adenopathy.     Upper Body:     Right upper body: No supraclavicular or axillary adenopathy.     Left upper body: No supraclavicular or axillary adenopathy.     Lower Body: No right inguinal adenopathy. No left inguinal adenopathy.  Skin:    General: Skin is warm.     Coloration: Skin is not jaundiced.     Findings: No lesion or rash.  Neurological:     General: No focal deficit present.     Mental Status: She is alert and oriented to person, place, and time. Mental status is at baseline.  Psychiatric:        Mood and Affect: Mood normal.        Behavior: Behavior normal.        Thought Content: Thought content normal.    LABS:      Latest Reference Range & Units 03/18/23 10:11  Sodium 135 - 145 mmol/L 140  Potassium 3.5 - 5.1 mmol/L 4.0  Chloride 98 - 111 mmol/L 102  CO2 22 - 32 mmol/L 29  Glucose 70 - 99 mg/dL 914 (H)  BUN 8 - 23 mg/dL 34 (H)  Creatinine 7.82 - 1.00 mg/dL 9.56 (  H)  Calcium 8.9 - 10.3 mg/dL 8.9  Anion gap 5 - 15  9  Alkaline Phosphatase 38 - 126 U/L 142 (H)  Albumin 3.5 - 5.0 g/dL 3.1 (L)  AST 15 - 41 U/L 29  ALT 0 - 44 U/L 19  Total Protein 6.5 - 8.1 g/dL 7.3  Total Bilirubin 0.3 - 1.2 mg/dL 1.3 (H)  GFR, Est Non African American >60 mL/min 40 (L)  (H): Data is abnormally high (L): Data is abnormally low   Latest Reference Range & Units 03/18/23 10:11  Iron 28 - 170 ug/dL 56  UIBC ug/dL 109  TIBC 604 - 540 ug/dL 981  Saturation Ratios 10.4 - 31.8 % 16  Ferritin 11 - 307 ng/mL 33    ASSESSMENT & PLAN:  A 67 y.o. female with pancytopenia secondary to NASH cirrhosis and secondary splenomegaly.  She also has iron deficiency anemia.  When comparing her labs today to previously, they are essentially unchanged, despite having received IV iron recently.  Her iron parameters are fine.  This further highlights how significant  her cirrhosis and splenomegaly are impacting her peripheral counts.  She understands her pancytopenia will likely be a chronic issue due to her cirrhosis.  It will remain important for her to follow up closely with hepatology to do whatever necessary to offset the severity of her NASH cirrhosis/end-stage liver disease.  Otherwise, I will see her back in 6 months for repeat clinical assessment.  The patient understands all the plans discussed today and is in agreement with them.  Cathern Tahir Kirby Funk, MD

## 2023-09-17 ENCOUNTER — Inpatient Hospital Stay: Payer: Medicare PPO | Admitting: Oncology

## 2023-09-17 ENCOUNTER — Telehealth: Payer: Self-pay | Admitting: Oncology

## 2023-09-17 ENCOUNTER — Inpatient Hospital Stay: Payer: Medicare PPO

## 2023-09-17 NOTE — Telephone Encounter (Signed)
09/17/23 Spoke with patient and rescheduled appts.

## 2023-09-28 ENCOUNTER — Other Ambulatory Visit: Payer: Medicare PPO

## 2023-09-28 ENCOUNTER — Inpatient Hospital Stay: Admit: 2023-09-28 | Discharge: 2023-09-28 | Payer: MEDICARE

## 2023-09-28 NOTE — Progress Notes (Signed)
 [facility]  [address] [place],  [place]  [address] [phone]  Clinic Day:  09/29/2023  Referring physician: Silver Lamar LABOR, MD   HISTORY OF PRESENT ILLNESS:  The patient is a 68 y.o. 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  Furthermore, she has had iron  deficiency anemia, for which she has needed IV iron  in the past to correct.  She comes in today to have her peripheral counts reassessed.  As it pertains to her baseline liver disease, her cirrhosis is getting worse to where she has needed paracenteses approximately once every 2 weeks.  She claims as much is 8 L of fluid removal of each session.  The patient claims she was not a candidate for TIPS procedure.  Currently, she feels very miserable as she was too weak to receive her paracentesis earlier this week.  Due to the significant ascites she has accumulated over time, she desperately wishes to have it addressed today.  With respect to her pancytopenia, she denies having worsening fatigue, overt forms of blood loss, or bleeding/bruising issues which concern her for worsening counts.  PHYSICAL EXAM:  Blood pressure (!) 158/62, pulse 80, temperature 98.6 F (37 C), temperature source Oral, resp. rate 18, height 5' 3 (1.6 m), weight (!) 339 lb 9.6 oz (154 kg), SpO2 100%. Wt Readings from Last 3 Encounters:  09/29/23 (!) 339 lb 9.6 oz (154 kg)  03/18/23 296 lb 9.6 oz (134.5 kg)  02/17/23 (!) 303 lb (137.4 kg)   Body mass index is 60.16 kg/m. Performance status (ECOG): 1 Physical Exam Constitutional:      Appearance: Normal appearance. She is not ill-appearing.     Comments: A visibly, extremely protuberant abdomen  HENT:     Mouth/Throat:     Mouth: Mucous membranes are moist.     Pharynx: Oropharynx is clear. No oropharyngeal exudate or posterior oropharyngeal erythema.  Cardiovascular:     Rate and Rhythm: Normal rate and regular rhythm.      Heart sounds: No murmur heard.    No friction rub. No gallop.  Pulmonary:     Effort: Pulmonary effort is normal. No respiratory distress.     Breath sounds: Normal breath sounds. No wheezing, rhonchi or rales.  Abdominal:     General: Abdomen is protuberant. Bowel sounds are normal. There is distension.     Palpations: Abdomen is soft. There is fluid wave. There is no mass.     Tenderness: There is no abdominal tenderness.  Musculoskeletal:        General: No swelling.     Right lower leg: No edema.     Left lower leg: No edema.  Lymphadenopathy:     Cervical: No cervical adenopathy.     Upper Body:     Right upper body: No supraclavicular or axillary adenopathy.     Left upper body: No supraclavicular or axillary adenopathy.     Lower Body: No right inguinal adenopathy. No left inguinal adenopathy.  Skin:    General: Skin is warm.     Coloration: Skin is not jaundiced.     Findings: No lesion or rash.  Neurological:     General: No focal deficit present.     Mental Status: She is alert and oriented to person, place, and time. Mental status is at baseline.  Psychiatric:        Mood and Affect: Mood normal.        Behavior:  Behavior normal.        Thought Content: Thought content normal.    LABS:    Latest Reference Range & Units 09/29/23 11:00 09/29/23 11:01  Sodium 135 - 145 mmol/L 141   Potassium 3.5 - 5.1 mmol/L 3.5   Chloride 98 - 111 mmol/L 105   CO2 22 - 32 mmol/L 25   Glucose 70 - 99 mg/dL 861 (H)   BUN 8 - 23 mg/dL 25 (H)   Creatinine 9.55 - 1.00 mg/dL 8.38 (H)   Calcium  8.9 - 10.3 mg/dL 9.1   Anion gap 5 - 15  11   Alkaline Phosphatase 38 - 126 U/L 165 (H)   Albumin 3.5 - 5.0 g/dL 3.5   AST 15 - 41 U/L 40   ALT 0 - 44 U/L 19   Total Protein 6.5 - 8.1 g/dL 6.5   Total Bilirubin 0.0 - 1.2 mg/dL 2.7 (H)   GFR, Est Non African American >60 mL/min 35 (L)   Iron  28 - 170 ug/dL 868   UIBC ug/dL 710   TIBC 749 - 549 ug/dL 579   Saturation Ratios 10.4 -  31.8 % 31   Ferritin 11 - 307 ng/mL 25   Folate >5.9 ng/mL  6.8  Vitamin B12 180 - 914 pg/mL 499   WBC 4.0 - 10.5 K/uL 2.8 (L)   RBC 3.87 - 5.11 MIL/uL 2.97 (L)   Hemoglobin 12.0 - 15.0 g/dL 8.1 (L)   HCT 63.9 - 53.9 % 26.5 (L)   MCV 80.0 - 100.0 fL 89.2   MCH 26.0 - 34.0 pg 27.3   MCHC 30.0 - 36.0 g/dL 69.3   RDW 88.4 - 84.4 % 17.4 (H)   Platelets 150 - 400 K/uL 42 (L)   nRBC 0.0 - 0.2 % 0 /100 WBC 0.00 0   Neutrophils % 79   Lymphocytes % 10   Monocytes Relative % 10   Eosinophil % 1   Basophil % 0   Immature Granulocytes % 0   NEUT# 1.7 - 7.7 K/uL 2.2   Lymphs Abs 0.7 - 4.0 K/uL 0.3 (L)   Monocyte # 0.1 - 1.0 K/uL 0.3   Eosinophils Absolute 0.0 - 0.5 K/uL 0.0   Basophils Absolute 0.0 - 0.1 K/uL 0.0   Abs Immature Granulocytes 0.00 - 0.07 K/uL 0.01   Immature Platelet Fraction 1.2 - 8.6 % 4.2   (H): Data is abnormally high (L): Data is abnormally low  ASSESSMENT & PLAN:  A 68 y.o. female with pancytopenia secondary to NASH cirrhosis and secondary splenomegaly.  She also has a history of iron  deficiency anemia.  When evaluating her labs today, the patient is anemic, but does not appear to be iron  deficient.  Her kidney parameters today are somewhat abnormal.  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  She definitely understands the most pressing health issue is her cirrhosis/end-stage liver disease, which could lead to her mortality within the next 2-3 years.  She has already been told that she is not a transplant candidate.  Based upon this, I do want her to discuss with her hepatologist about having a Pleurx catheter placed to where she can remove excessive ascites whenever she becomes uncomfortable.  In clinic today, the patient requested to go to the emergency room to have a palliative paracentesis done.  I will acquiesce to her wishes; I personally called the emergency room physician at the  local  hospital, where she can be evaluated by interventional radiology and undergo a paracentesis today.  She also knows to discuss with them the possibility of having a Pleurx catheter placed to where she can drain her own ascites when it becomes very prominent, as it is today.  From a pancytopenia standpoint, I will see her back in 6 months for repeat clinical assessment.  The patient understands all the plans discussed today and is in agreement with them.  Charnese Federici DELENA Kerns, MD

## 2023-09-29 ENCOUNTER — Telehealth: Payer: Self-pay

## 2023-09-29 ENCOUNTER — Inpatient Hospital Stay: Payer: Medicare PPO | Attending: Oncology

## 2023-09-29 ENCOUNTER — Inpatient Hospital Stay (HOSPITAL_BASED_OUTPATIENT_CLINIC_OR_DEPARTMENT_OTHER): Payer: Medicare PPO | Admitting: Oncology

## 2023-09-29 ENCOUNTER — Other Ambulatory Visit: Payer: Self-pay | Admitting: Oncology

## 2023-09-29 VITALS — BP 158/62 | HR 80 | Temp 98.6°F | Resp 18 | Ht 63.0 in | Wt 339.6 lb

## 2023-09-29 DIAGNOSIS — D509 Iron deficiency anemia, unspecified: Secondary | ICD-10-CM | POA: Diagnosis not present

## 2023-09-29 DIAGNOSIS — D61818 Other pancytopenia: Secondary | ICD-10-CM

## 2023-09-29 DIAGNOSIS — R161 Splenomegaly, not elsewhere classified: Secondary | ICD-10-CM | POA: Diagnosis not present

## 2023-09-29 DIAGNOSIS — K7460 Unspecified cirrhosis of liver: Secondary | ICD-10-CM | POA: Insufficient documentation

## 2023-09-29 LAB — CMP (CANCER CENTER ONLY)
ALT: 19 U/L (ref 0–44)
AST: 40 U/L (ref 15–41)
Albumin: 3.5 g/dL (ref 3.5–5.0)
Alkaline Phosphatase: 165 U/L — ABNORMAL HIGH (ref 38–126)
Anion gap: 11 (ref 5–15)
BUN: 25 mg/dL — ABNORMAL HIGH (ref 8–23)
CO2: 25 mmol/L (ref 22–32)
Calcium: 9.1 mg/dL (ref 8.9–10.3)
Chloride: 105 mmol/L (ref 98–111)
Creatinine: 1.61 mg/dL — ABNORMAL HIGH (ref 0.44–1.00)
GFR, Estimated: 35 mL/min — ABNORMAL LOW (ref >60)
Glucose, Bld: 138 mg/dL — ABNORMAL HIGH (ref 70–99)
Potassium: 3.5 mmol/L (ref 3.5–5.1)
Sodium: 141 mmol/L (ref 135–145)
Total Bilirubin: 2.7 mg/dL — ABNORMAL HIGH (ref 0.0–1.2)
Total Protein: 6.5 g/dL (ref 6.5–8.1)

## 2023-09-29 LAB — CBC WITH DIFFERENTIAL (CANCER CENTER ONLY)
Abs Immature Granulocytes: 0.01 10*3/uL (ref 0.00–0.07)
Basophils Absolute: 0 10*3/uL (ref 0.0–0.1)
Basophils Relative: 0 %
Eosinophils Absolute: 0 10*3/uL (ref 0.0–0.5)
Eosinophils Relative: 1 %
HCT: 26.5 % — ABNORMAL LOW (ref 36.0–46.0)
Hemoglobin: 8.1 g/dL — ABNORMAL LOW (ref 12.0–15.0)
Immature Granulocytes: 0 %
Immature Platelet Fraction: 4.2 % (ref 1.2–8.6)
Lymphocytes Relative: 10 %
Lymphs Abs: 0.3 10*3/uL — ABNORMAL LOW (ref 0.7–4.0)
MCH: 27.3 pg (ref 26.0–34.0)
MCHC: 30.6 g/dL (ref 30.0–36.0)
MCV: 89.2 fL (ref 80.0–100.0)
Monocytes Absolute: 0.3 10*3/uL (ref 0.1–1.0)
Monocytes Relative: 10 %
Neutro Abs: 2.2 10*3/uL (ref 1.7–7.7)
Neutrophils Relative %: 79 %
Platelet Count: 42 10*3/uL — ABNORMAL LOW (ref 150–400)
RBC: 2.97 MIL/uL — ABNORMAL LOW (ref 3.87–5.11)
RDW: 17.4 % — ABNORMAL HIGH (ref 11.5–15.5)
WBC Count: 2.8 10*3/uL — ABNORMAL LOW (ref 4.0–10.5)
nRBC: 0 % (ref 0.0–0.2)
nRBC: 0 /100{WBCs}

## 2023-09-29 LAB — IRON AND TIBC
Iron: 131 ug/dL (ref 28–170)
Saturation Ratios: 31 % (ref 10.4–31.8)
TIBC: 420 ug/dL (ref 250–450)
UIBC: 289 ug/dL

## 2023-09-29 LAB — VITAMIN B12: Vitamin B-12: 499 pg/mL (ref 180–914)

## 2023-09-29 LAB — FOLATE: Folate: 6.8 ng/mL (ref >5.9)

## 2023-09-29 LAB — FERRITIN: Ferritin: 25 ng/mL (ref 11–307)

## 2023-09-29 NOTE — Telephone Encounter (Addendum)
 Latest Reference Range & Units 09/29/23 11:00 09/29/23 11:01  Iron  28 - 170 ug/dL 868   UIBC ug/dL 710   TIBC 749 - 549 ug/dL 579   Saturation Ratios 10.4 - 31.8 % 31   Ferritin 11 - 307 ng/mL 25   Folate >5.9 ng/mL  6.8  Vitamin B12 180 - 914 pg/mL 499    ASSESSMENT & PLAN:  A 68 y.o. female with pancytopenia secondary to NASH cirrhosis and secondary splenomegaly.  She also has a history of iron  deficiency anemia.  When evaluating her labs today, the patient is anemic, but does not appear to be iron  deficient.  Her kidney parameters today are somewhat abnormal.  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  She definitely understands the most pressing health issue is her cirrhosis/end-stage liver disease, which could lead to her mortality within the next 2-3 years.  She has already been told that she is not a transplant candidate.  Based upon this, I do want her to discuss with her hepatologist about having a Pleurx catheter placed to where she can remove excessive ascites whenever she becomes uncomfortable.  In clinic today, the patient requested to go to the emergency room to have a palliative paracentesis done.  I will acquiesce to her wishes; I personally called the emergency room physician at the local hospital, where she can be evaluated by interventional radiology and undergo a paracentesis today.  She also knows to discuss with them the possibility of having a Pleurx catheter placed to where she can drain her own ascites when it becomes very prominent, as it is today.  From a pancytopenia standpoint, I will see her back in 6 months for repeat clinical assessment.  The patient understands all the plans discussed today and is in agreement with them.   Valaria DELENA Kerns, MD               Electronically signed by Kerns Valaria DELENA, MD at 09/29/2023  2:52 PM

## 2023-09-30 ENCOUNTER — Encounter: Payer: Self-pay | Admitting: Oncology

## 2023-09-30 DIAGNOSIS — R188 Other ascites: Principal | ICD-10-CM

## 2023-10-01 DIAGNOSIS — K7460 Unspecified cirrhosis of liver: Principal | ICD-10-CM

## 2023-10-01 DIAGNOSIS — K7581 Nonalcoholic steatohepatitis (NASH): Principal | ICD-10-CM

## 2023-10-01 DIAGNOSIS — K7682 Hepatic encephalopathy (CMS-HCC): Principal | ICD-10-CM

## 2023-10-01 DIAGNOSIS — R188 Other ascites: Principal | ICD-10-CM

## 2023-10-06 ENCOUNTER — Encounter: Admit: 2023-10-06 | Discharge: 2023-10-07 | Payer: MEDICARE

## 2023-10-06 DIAGNOSIS — R188 Other ascites: Principal | ICD-10-CM

## 2023-10-07 ENCOUNTER — Inpatient Hospital Stay: Admission: RE | Admit: 2023-10-07 | Payer: Medicare PPO | Source: Ambulatory Visit

## 2023-10-12 ENCOUNTER — Inpatient Hospital Stay: Admit: 2023-10-12 | Discharge: 2023-10-12 | Payer: MEDICARE

## 2023-10-12 DIAGNOSIS — R188 Other ascites: Principal | ICD-10-CM
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
## 2023-10-19 ENCOUNTER — Emergency Department
Admit: 2023-10-19 | Discharge: 2023-10-19 | Disposition: A | Discharge status: Home / Self Care | Payer: MEDICARE | Attending: Family Medicine

## 2023-10-20 ENCOUNTER — Other Ambulatory Visit: Payer: Medicare PPO

## 2023-10-20 ENCOUNTER — Ambulatory Visit
Admit: 2023-10-20 | Discharge: 2023-10-21 | Payer: MEDICARE | Attending: Student in an Organized Health Care Education/Training Program | Primary: Student in an Organized Health Care Education/Training Program

## 2023-10-20 DIAGNOSIS — L089 Local infection of the skin and subcutaneous tissue, unspecified: Principal | ICD-10-CM

## 2023-10-22 ENCOUNTER — Encounter: Payer: Self-pay | Admitting: Cardiology

## 2023-10-22 ENCOUNTER — Ambulatory Visit: Payer: Medicare PPO | Attending: Cardiology | Admitting: Cardiology

## 2023-10-22 VITALS — BP 140/40 | HR 76 | Ht 63.0 in | Wt 328.8 lb

## 2023-10-22 DIAGNOSIS — R188 Other ascites: Principal | ICD-10-CM

## 2023-10-22 DIAGNOSIS — I5043 Acute on chronic combined systolic (congestive) and diastolic (congestive) heart failure: Secondary | ICD-10-CM

## 2023-10-22 DIAGNOSIS — K7581 Nonalcoholic steatohepatitis (NASH): Secondary | ICD-10-CM | POA: Diagnosis not present

## 2023-10-22 DIAGNOSIS — I428 Other cardiomyopathies: Secondary | ICD-10-CM

## 2023-10-22 DIAGNOSIS — I447 Left bundle-branch block, unspecified: Secondary | ICD-10-CM

## 2023-10-22 DIAGNOSIS — K7460 Unspecified cirrhosis of liver: Secondary | ICD-10-CM

## 2023-10-22 DIAGNOSIS — I10 Essential (primary) hypertension: Secondary | ICD-10-CM | POA: Diagnosis not present

## 2023-10-22 NOTE — Patient Instructions (Signed)
 Medication Instructions:  Continue same medications *If you need a refill on your cardiac medications before your next appointment, please call your pharmacy*   Lab Work: None ordered   Testing/Procedures: None ordered   Follow-Up: At [facility], you and your health needs are our priority.  As part of our continuing mission to provide you with exceptional heart care, we have created designated Provider Care Teams.  These Care Teams include your primary Cardiologist (physician) and Advanced Practice Providers (APPs -  Physician Assistants and Nurse Practitioners) who all work together to provide you with the care you need, when you need it.  We recommend signing up for the patient portal called "MyChart".  Sign up information is provided on this After Visit Summary.  MyChart is used to connect with patients for Virtual Visits (Telemedicine).  Patients are able to view lab/test results, encounter notes, upcoming appointments, etc.  Non-urgent messages can be sent to your provider as well.   To learn more about what you can do with MyChart, go to [email or web address].    Your next appointment:  1 year   Call in Oct to schedule Jan appointment     Provider:  Dr.Jordan

## 2023-10-29 ENCOUNTER — Ambulatory Visit: Admit: 2023-10-29 | Discharge: 2023-10-30 | Payer: MEDICARE

## 2023-10-29 DIAGNOSIS — R188 Other ascites: Principal | ICD-10-CM

## 2023-11-04 ENCOUNTER — Other Ambulatory Visit: Payer: Medicare PPO

## 2023-11-22 ENCOUNTER — Encounter: Admit: 2023-11-22 | Discharge: 2023-11-23 | Payer: MEDICARE

## 2023-11-22 DIAGNOSIS — E66813 Class 3 severe obesity with serious comorbidity and body mass index (BMI) of 50.0 to 59.9 in adult, unspecified obesity type (CMS-HCC): Principal | ICD-10-CM

## 2023-11-22 DIAGNOSIS — K7460 Unspecified cirrhosis of liver: Principal | ICD-10-CM

## 2023-11-22 DIAGNOSIS — R600 Localized edema: Principal | ICD-10-CM

## 2023-11-22 DIAGNOSIS — K7682 Hepatic encephalopathy (CMS-HCC): Principal | ICD-10-CM

## 2023-11-22 DIAGNOSIS — I428 Other cardiomyopathies: Principal | ICD-10-CM

## 2023-11-22 DIAGNOSIS — K7290 Hepatic failure, unspecified without coma: Principal | ICD-10-CM

## 2023-11-22 DIAGNOSIS — K7581 Nonalcoholic steatohepatitis (NASH): Principal | ICD-10-CM

## 2023-11-22 DIAGNOSIS — K31819 Angiodysplasia of stomach and duodenum without bleeding: Principal | ICD-10-CM

## 2023-11-22 DIAGNOSIS — I81 Portal vein thrombosis: Principal | ICD-10-CM

## 2023-11-22 DIAGNOSIS — Z6841 Body Mass Index (BMI) 40.0 and over, adult: Principal | ICD-10-CM

## 2023-11-22 DIAGNOSIS — Z794 Long term (current) use of insulin: Principal | ICD-10-CM

## 2023-11-22 DIAGNOSIS — E1142 Type 2 diabetes mellitus with diabetic polyneuropathy: Principal | ICD-10-CM

## 2023-11-22 DIAGNOSIS — I8510 Secondary esophageal varices without bleeding: Principal | ICD-10-CM

## 2023-11-22 DIAGNOSIS — K766 Portal hypertension: Principal | ICD-10-CM

## 2023-11-30 ENCOUNTER — Ambulatory Visit: Admit: 2023-11-30 | Discharge: 2023-12-01 | Payer: MEDICARE

## 2023-12-05 ENCOUNTER — Emergency Department: Admit: 2023-12-05 | Discharge: 2023-12-06 | Disposition: A | Discharge status: Home / Self Care | Payer: MEDICARE

## 2023-12-05 DIAGNOSIS — L03311 Cellulitis of abdominal wall: Principal | ICD-10-CM

## 2023-12-05 MED ORDER — CLINDAMYCIN HCL 300 MG CAPSULE
ORAL_CAPSULE | Freq: Four times a day (QID) | ORAL | 0 refills | 7.00 days | Status: CP
Start: 2023-12-05 — End: 2023-12-12

## 2023-12-05 MED ORDER — DOXYCYCLINE HYCLATE 100 MG CAPSULE
ORAL_CAPSULE | Freq: Two times a day (BID) | ORAL | 0 refills | 10.00 days | Status: CP
Start: 2023-12-05 — End: 2023-12-05

## 2023-12-18 ENCOUNTER — Encounter
Admit: 2023-12-18 | Discharge: 2023-12-20 | Disposition: A | Discharge status: Home / Self Care | Admitting: Student in an Organized Health Care Education/Training Program

## 2023-12-18 ENCOUNTER — Inpatient Hospital Stay
Admit: 2023-12-18 | Discharge: 2023-12-20 | Disposition: A | Discharge status: Home / Self Care | Admitting: Student in an Organized Health Care Education/Training Program

## 2023-12-18 ENCOUNTER — Ambulatory Visit
Admit: 2023-12-18 | Discharge: 2023-12-20 | Disposition: A | Discharge status: Home / Self Care | Admitting: Student in an Organized Health Care Education/Training Program

## 2023-12-20 MED ORDER — AMOXICILLIN 875 MG-POTASSIUM CLAVULANATE 125 MG TABLET
ORAL_TABLET | Freq: Two times a day (BID) | ORAL | 0 refills | 60 days | Status: CP
Start: 2023-12-20 — End: 2024-02-18
  Filled 2023-12-20: qty 120, 60d supply, fill #0

## 2023-12-20 MED ORDER — DOXYCYCLINE HYCLATE 100 MG TABLET
ORAL_TABLET | Freq: Two times a day (BID) | ORAL | 0 refills | 4 days | Status: CP
Start: 2023-12-20 — End: 2023-12-24
  Filled 2023-12-20: qty 8, 4d supply, fill #0

## 2023-12-21 MED ORDER — FERROUS SULFATE 325 MG (65 MG IRON) TABLET
ORAL_TABLET | ORAL | 11 refills | 30 days | Status: CP
Start: 2023-12-21 — End: 2024-12-20
  Filled 2023-12-20: qty 15, 30d supply, fill #0

## 2023-12-26 ENCOUNTER — Emergency Department
Admit: 2023-12-26 | Discharge: 2023-12-27 | Disposition: A | Discharge status: Home / Self Care | Attending: Emergency Medicine

## 2023-12-26 ENCOUNTER — Emergency Department
Admit: 2023-12-26 | Discharge: 2023-12-27 | Disposition: A | Discharge status: Home / Self Care | Payer: Medicare (Managed Care) | Attending: Emergency Medicine

## 2023-12-29 ENCOUNTER — Encounter: Admit: 2023-12-29 | Discharge: 2023-12-30 | Payer: Medicare (Managed Care)

## 2023-12-29 ENCOUNTER — Inpatient Hospital Stay
Admit: 2023-12-29 | Discharge: 2024-01-02 | Disposition: A | Discharge status: Home / Self Care | Payer: Medicare (Managed Care)

## 2023-12-29 ENCOUNTER — Encounter
Admit: 2023-12-29 | Discharge: 2024-01-02 | Disposition: A | Discharge status: Home / Self Care | Attending: Student in an Organized Health Care Education/Training Program

## 2023-12-29 ENCOUNTER — Ambulatory Visit
Admit: 2023-12-29 | Discharge: 2024-01-02 | Disposition: A | Discharge status: Home / Self Care | Payer: Medicare (Managed Care)

## 2024-01-02 MED ORDER — GLUCOSE 4 GRAM CHEWABLE TABLET
ORAL_TABLET | ORAL | 12 refills | 1.00 days | Status: CP | PRN
Start: 2024-01-02 — End: 2025-01-01
  Filled 2024-01-02: qty 50, 1d supply, fill #0

## 2024-01-02 MED ORDER — CEPHALEXIN 500 MG CAPSULE
ORAL_CAPSULE | Freq: Four times a day (QID) | ORAL | 0 refills | 3.00 days | Status: CP
Start: 2024-01-02 — End: 2024-01-08
  Filled 2024-01-02: qty 11, 3d supply, fill #0

## 2024-01-02 MED ORDER — DOXYCYCLINE HYCLATE 100 MG TABLET
ORAL_TABLET | Freq: Two times a day (BID) | ORAL | 0 refills | 6.00 days | Status: CP
Start: 2024-01-02 — End: 2024-01-08
  Filled 2024-01-02: qty 12, 6d supply, fill #0

## 2024-01-03 MED ORDER — KETOCONAZOLE 2 % TOPICAL CREAM
Freq: Every day | TOPICAL | 11 refills | 60.00 days | Status: CP
Start: 2024-01-03 — End: 2025-01-02
  Filled 2024-01-02: qty 30, 45d supply, fill #0

## 2024-01-11 ENCOUNTER — Inpatient Hospital Stay: Admit: 2024-01-11 | Discharge: 2024-01-11 | Payer: MEDICARE

## 2024-01-11 DIAGNOSIS — R188 Other ascites: Principal | ICD-10-CM

## 2024-01-11 DIAGNOSIS — K7460 Unspecified cirrhosis of liver: Principal | ICD-10-CM

## 2024-01-11 DIAGNOSIS — K7290 Hepatic failure, unspecified without coma: Principal | ICD-10-CM

## 2024-02-09 ENCOUNTER — Inpatient Hospital Stay: Admit: 2024-02-09 | Discharge: 2024-02-09 | Payer: Medicare (Managed Care)

## 2024-02-23 ENCOUNTER — Inpatient Hospital Stay: Admit: 2024-02-23 | Discharge: 2024-02-23 | Payer: Medicare (Managed Care)

## 2024-03-23 ENCOUNTER — Other Ambulatory Visit: Payer: Self-pay

## 2024-03-23 DIAGNOSIS — K7460 Unspecified cirrhosis of liver: Secondary | ICD-10-CM

## 2024-03-27 NOTE — Progress Notes (Unsigned)
 [facility]  [address] [place],  [place]  [address] [phone]  Clinic Day:  03/28/2024  Referring physician: Silver Lamar LABOR, MD   HISTORY OF PRESENT ILLNESS:  The patient is a 68 y.o. 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  Furthermore, she has had iron  deficiency anemia, for which she has needed IV iron  in the past to correct.  She comes in today to have her peripheral counts reassessed.  Since her last visit, the patient has had further complications from her baseline cirrhosis.  She did have a Pleurx catheter placed to where she could undergo routine paracenteses.  Unfortunately, her catheter got infected to where it had to be removed.  Furthermore, she claims she has been hospitalized multiple times over these past few months, with most of those admissions being due to spontaneous bacterial peritonitis.  She has also had problems with cellulitis in her lower extremities.  She is currently on a schedule to where she undergoes a paracentesis once every 2 weeks.  Due to the decline in her health, the patient is seriously considering hospice care.  She is expected to hear from them within the next 2 days with respect to if she will be enrolled into their program.    PHYSICAL EXAM:  Blood pressure (!) 143/54, pulse 73, temperature 97.6 F (36.4 C), temperature source Oral, resp. rate 16, height 5' 3 (1.6 m), weight 286 lb 1.6 oz (129.8 kg), SpO2 99%. Wt Readings from Last 3 Encounters:  03/28/24 286 lb 1.6 oz (129.8 kg)  10/22/23 (!) 328 lb 12.8 oz (149.1 kg)  09/29/23 (!) 339 lb 9.6 oz (154 kg)   Body mass index is 50.68 kg/m. Performance status (ECOG): 2 Physical Exam Constitutional:      Appearance: Normal appearance. She is not ill-appearing.     Comments: A chronically ill-appearing woman ambulating with a walker.  She has an  extremely protuberant abdomen  HENT:     Mouth/Throat:      Mouth: Mucous membranes are moist.     Pharynx: Oropharynx is clear. No oropharyngeal exudate or posterior oropharyngeal erythema.  Cardiovascular:     Rate and Rhythm: Normal rate and regular rhythm.     Heart sounds: No murmur heard.    No friction rub. No gallop.  Pulmonary:     Effort: Pulmonary effort is normal. No respiratory distress.     Breath sounds: Normal breath sounds. No wheezing, rhonchi or rales.  Abdominal:     General: Abdomen is protuberant. Bowel sounds are normal. There is distension.     Palpations: Abdomen is soft. There is fluid wave. There is no mass.     Tenderness: There is no abdominal tenderness.  Musculoskeletal:        General: No swelling.     Right lower leg: No edema.     Left lower leg: No edema.  Lymphadenopathy:     Cervical: No cervical adenopathy.     Upper Body:     Right upper body: No supraclavicular or axillary adenopathy.     Left upper body: No supraclavicular or axillary adenopathy.     Lower Body: No right inguinal adenopathy. No left inguinal adenopathy.  Skin:    General: Skin is warm.     Coloration: Skin is not jaundiced.     Findings: No lesion or rash.  Neurological:     General: No focal deficit present.     Mental  Status: She is alert and oriented to person, place, and time. Mental status is at baseline.  Psychiatric:        Mood and Affect: Mood normal.        Behavior: Behavior normal.        Thought Content: Thought content normal.    LABS:    Latest Reference Range & Units 03/28/24 13:19  Sodium 135 - 145 mmol/L 139  Potassium 3.5 - 5.1 mmol/L 3.8  Chloride 98 - 111 mmol/L 105  CO2 22 - 32 mmol/L 24  Glucose 70 - 99 mg/dL 881 (H)  BUN 8 - 23 mg/dL 41 (H)  Creatinine 9.55 - 1.00 mg/dL 8.05 (H)  Calcium  8.9 - 10.3 mg/dL 9.2  Anion gap 5 - 15  11  Alkaline Phosphatase 38 - 126 U/L 182 (H)  Albumin 3.5 - 5.0 g/dL 3.2 (L)  AST 15 - 41 U/L 31  ALT 0 - 44 U/L 16  Total Protein 6.5 - 8.1 g/dL 6.6  Total Bilirubin  0.0 - 1.2 mg/dL 1.2  GFR, Est Non African American >60 mL/min 28 (L)  WBC 4.0 - 10.5 K/uL 2.4 (L)  RBC 3.87 - 5.11 MIL/uL 2.47 (L)  Hemoglobin 12.0 - 15.0 g/dL 6.8 (LL)  HCT 63.9 - 53.9 % 23.0 (L)  MCV 80.0 - 100.0 fL 93.1  MCH 26.0 - 34.0 pg 27.5  MCHC 30.0 - 36.0 g/dL 70.3 (L)  RDW 88.4 - 84.4 % 15.5  Platelets 150 - 400 K/uL 50 (L)  nRBC 0.0 - 0.2 % 0.0  Neutrophils % 72  Lymphocytes % 13  Monocytes Relative % 11  Eosinophil % 3  Basophil % 1  Immature Granulocytes % 0  NEUT# 1.7 - 7.7 K/uL 1.7  Lymphs Abs 0.7 - 4.0 K/uL 0.3 (L)  Monocyte # 0.1 - 1.0 K/uL 0.3  Eosinophils Absolute 0.0 - 0.5 K/uL 0.1  Basophils Absolute 0.0 - 0.1 K/uL 0.0  Abs Immature Granulocytes 0.00 - 0.07 K/uL 0.00  Immature Platelet Fraction 1.2 - 8.6 % 3.6  (LL): Data is critically low (H): Data is abnormally high (L): Data is abnormally low   Latest Reference Range & Units 03/28/24 13:19 03/28/24 13:20  Iron  28 - 170 ug/dL  29  UIBC ug/dL  653  TIBC 749 - 549 ug/dL  624  Saturation Ratios 10.4 - 31.8 %  8 (L)  Ferritin 11 - 307 ng/mL  17  Folate >5.9 ng/mL  8.6  Vitamin B12 180 - 914 pg/mL 428   (L): Data is abnormally low  ASSESSMENT & PLAN:  A 68 y.o. female with pancytopenia secondary to NASH cirrhosis and secondary splenomegaly.  As her hemoglobin is low today, I will arrange for her to receive 2 units of packed red blood cells.  Her iron  parameters today are clearly trending downwards.  Based upon this, I will arrange for her to receive IV iron  over these next few weeks to improve her iron  stores and hemoglobin over time.  As mentioned previously, the patient is seriously considering hospice care moving forward.  Until that has been definitively established, I will continue to follow her every 6 months to ensure her peripheral counts are not precipitously falling.  The patient understands all the plans discussed today and is in agreement with them.  Myreon Wimer DELENA Kerns, MD

## 2024-03-28 ENCOUNTER — Other Ambulatory Visit: Payer: Self-pay | Admitting: Oncology

## 2024-03-28 ENCOUNTER — Inpatient Hospital Stay: Payer: Medicare PPO

## 2024-03-28 ENCOUNTER — Other Ambulatory Visit: Payer: Self-pay

## 2024-03-28 ENCOUNTER — Inpatient Hospital Stay: Payer: Medicare PPO | Attending: Oncology | Admitting: Oncology

## 2024-03-28 VITALS — BP 143/54 | HR 73 | Temp 97.6°F | Resp 16 | Ht 63.0 in | Wt 286.1 lb

## 2024-03-28 DIAGNOSIS — K7581 Nonalcoholic steatohepatitis (NASH): Secondary | ICD-10-CM | POA: Diagnosis not present

## 2024-03-28 DIAGNOSIS — D649 Anemia, unspecified: Secondary | ICD-10-CM

## 2024-03-28 DIAGNOSIS — D509 Iron deficiency anemia, unspecified: Secondary | ICD-10-CM | POA: Diagnosis present

## 2024-03-28 DIAGNOSIS — K7460 Unspecified cirrhosis of liver: Secondary | ICD-10-CM

## 2024-03-28 DIAGNOSIS — D61818 Other pancytopenia: Secondary | ICD-10-CM

## 2024-03-28 DIAGNOSIS — D696 Thrombocytopenia, unspecified: Secondary | ICD-10-CM

## 2024-03-28 DIAGNOSIS — R161 Splenomegaly, not elsewhere classified: Secondary | ICD-10-CM | POA: Insufficient documentation

## 2024-03-28 LAB — CMP (CANCER CENTER ONLY)
ALT: 16 U/L (ref 0–44)
AST: 31 U/L (ref 15–41)
Albumin: 3.2 g/dL — ABNORMAL LOW (ref 3.5–5.0)
Alkaline Phosphatase: 182 U/L — ABNORMAL HIGH (ref 38–126)
Anion gap: 11 (ref 5–15)
BUN: 41 mg/dL — ABNORMAL HIGH (ref 8–23)
CO2: 24 mmol/L (ref 22–32)
Calcium: 9.2 mg/dL (ref 8.9–10.3)
Chloride: 105 mmol/L (ref 98–111)
Creatinine: 1.94 mg/dL — ABNORMAL HIGH (ref 0.44–1.00)
GFR, Estimated: 28 mL/min — ABNORMAL LOW (ref >60)
Glucose, Bld: 118 mg/dL — ABNORMAL HIGH (ref 70–99)
Potassium: 3.8 mmol/L (ref 3.5–5.1)
Sodium: 139 mmol/L (ref 135–145)
Total Bilirubin: 1.2 mg/dL (ref 0.0–1.2)
Total Protein: 6.6 g/dL (ref 6.5–8.1)

## 2024-03-28 LAB — CBC WITH DIFFERENTIAL (CANCER CENTER ONLY)
Abs Immature Granulocytes: 0 K/uL (ref 0.00–0.07)
Basophils Absolute: 0 K/uL (ref 0.0–0.1)
Basophils Relative: 1 %
Eosinophils Absolute: 0.1 K/uL (ref 0.0–0.5)
Eosinophils Relative: 3 %
HCT: 23 % — ABNORMAL LOW (ref 36.0–46.0)
Hemoglobin: 6.8 g/dL — CL (ref 12.0–15.0)
Immature Granulocytes: 0 %
Immature Platelet Fraction: 3.6 % (ref 1.2–8.6)
Lymphocytes Relative: 13 %
Lymphs Abs: 0.3 K/uL — ABNORMAL LOW (ref 0.7–4.0)
MCH: 27.5 pg (ref 26.0–34.0)
MCHC: 29.6 g/dL — ABNORMAL LOW (ref 30.0–36.0)
MCV: 93.1 fL (ref 80.0–100.0)
Monocytes Absolute: 0.3 K/uL (ref 0.1–1.0)
Monocytes Relative: 11 %
Neutro Abs: 1.7 K/uL (ref 1.7–7.7)
Neutrophils Relative %: 72 %
Platelet Count: 50 K/uL — ABNORMAL LOW (ref 150–400)
RBC: 2.47 MIL/uL — ABNORMAL LOW (ref 3.87–5.11)
RDW: 15.5 % (ref 11.5–15.5)
WBC Count: 2.4 K/uL — ABNORMAL LOW (ref 4.0–10.5)
nRBC: 0 % (ref 0.0–0.2)

## 2024-03-28 LAB — IRON AND TIBC
Iron: 29 ug/dL (ref 28–170)
Saturation Ratios: 8 % — ABNORMAL LOW (ref 10.4–31.8)
TIBC: 375 ug/dL (ref 250–450)
UIBC: 346 ug/dL

## 2024-03-28 LAB — FOLATE: Folate: 8.6 ng/mL (ref >5.9)

## 2024-03-28 LAB — FERRITIN: Ferritin: 17 ng/mL (ref 11–307)

## 2024-03-28 LAB — SAMPLE TO BLOOD BANK

## 2024-03-28 LAB — VITAMIN B12: Vitamin B-12: 428 pg/mL (ref 180–914)

## 2024-03-28 LAB — PREPARE RBC (CROSSMATCH)

## 2024-03-28 NOTE — Progress Notes (Signed)
 CRITICAL VALUE STICKER  CRITICAL VALUE:  Hgb 6.8  RECEIVER (on-site recipient of call):  Woodie Kapur RN  DATE & TIME NOTIFIED:   03/28/2024 1344  MESSENGER (representative from lab):  [facility] HEATH lab  MD NOTIFIED:   Dr. Ezzard  TIME OF NOTIFICATION:  1404  RESPONSE:  Transfuse 2 units PRBC.

## 2024-03-29 ENCOUNTER — Telehealth: Payer: Self-pay

## 2024-03-29 ENCOUNTER — Encounter: Payer: Self-pay | Admitting: Oncology

## 2024-03-29 ENCOUNTER — Telehealth: Payer: Self-pay | Admitting: Oncology

## 2024-03-29 ENCOUNTER — Encounter

## 2024-03-29 NOTE — Telephone Encounter (Signed)
 Dr Ezzard: Please arrange for pt to receive IV iron  in the near future....thx    Latest Reference Range & Units 03/28/24 13:20  Iron  28 - 170 ug/dL 29  UIBC ug/dL 653  TIBC 749 - 549 ug/dL 624  Saturation Ratios 10.4 - 31.8 % 8 (L)  Ferritin 11 - 307 ng/mL 17  Folate >5.9 ng/mL 8.6    Latest Reference Range & Units 03/28/24 13:19  Vitamin B12 180 - 914 pg/mL 428    Latest Reference Range & Units 03/28/24 13:19  Hemoglobin 12.0 - 15.0 g/dL 6.8 (LL)  HCT 63.9 - 53.9 % 23.0 (L)  (LL): Data is critically low (L): Data is abnormally low

## 2024-03-29 NOTE — Telephone Encounter (Signed)
 Contacted pt to schedule an appt. Unable to reach via phone, voicemail was left.   Per Dr. Valaria Kerns: Please arrange for pt to receive IV iron  in the near future....thx

## 2024-03-29 NOTE — Telephone Encounter (Signed)
 Patient has been scheduled. Aware of appt dates and times.

## 2024-03-30 ENCOUNTER — Inpatient Hospital Stay

## 2024-03-30 VITALS — BP 130/50 | HR 67 | Temp 97.7°F | Resp 20

## 2024-03-30 DIAGNOSIS — D509 Iron deficiency anemia, unspecified: Secondary | ICD-10-CM | POA: Diagnosis not present

## 2024-03-30 DIAGNOSIS — D649 Anemia, unspecified: Secondary | ICD-10-CM

## 2024-03-30 DIAGNOSIS — D61818 Other pancytopenia: Secondary | ICD-10-CM

## 2024-03-30 DIAGNOSIS — D696 Thrombocytopenia, unspecified: Secondary | ICD-10-CM

## 2024-03-30 MED ORDER — IRON SUCROSE 20 MG/ML IV SOLN
200.0000 mg | Freq: Once | INTRAVENOUS | Status: AC
  Administered 2024-03-30: 200 mg via INTRAVENOUS
  Filled 2024-03-30: qty 10

## 2024-03-30 MED ORDER — DIPHENHYDRAMINE HCL 25 MG PO CAPS
25.0000 mg | ORAL_CAPSULE | Freq: Once | ORAL | Status: AC
  Administered 2024-03-30: 25 mg via ORAL
  Filled 2024-03-30: qty 1

## 2024-03-30 MED ORDER — ACETAMINOPHEN 325 MG PO TABS
650.0000 mg | ORAL_TABLET | Freq: Once | ORAL | Status: AC
  Administered 2024-03-30: 650 mg via ORAL
  Filled 2024-03-30: qty 2

## 2024-03-30 MED ORDER — SODIUM CHLORIDE 0.9% IV SOLUTION
250.0000 mL | INTRAVENOUS | Status: DC
  Administered 2024-03-30 (×2): 250 mL via INTRAVENOUS

## 2024-03-30 NOTE — Patient Instructions (Signed)

## 2024-03-31 LAB — TYPE AND SCREEN
ABO/RH(D): A POS
Antibody Screen: NEGATIVE
Unit division: 0
Unit division: 0

## 2024-03-31 LAB — BPAM RBC
Blood Product Expiration Date: 202508042359
Blood Product Expiration Date: 202508052359
ISSUE DATE / TIME: 202507100737
ISSUE DATE / TIME: 202507100737
Unit Type and Rh: 6200
Unit Type and Rh: 6200

## 2024-04-03 ENCOUNTER — Ambulatory Visit

## 2024-04-05 ENCOUNTER — Inpatient Hospital Stay

## 2024-04-07 ENCOUNTER — Ambulatory Visit

## 2024-04-11 ENCOUNTER — Ambulatory Visit

## 2024-09-21 DEATH — deceased

## 2024-09-28 ENCOUNTER — Other Ambulatory Visit

## 2024-09-28 ENCOUNTER — Ambulatory Visit: Admitting: Oncology
